# Patient Record
Sex: Female | Born: 1990 | Race: Black or African American | Hispanic: No | Marital: Single | Smoking: Former smoker
Health system: Southern US, Community
[De-identification: ages and names within clinical notes are randomized; demographics above are authoritative.]

## PROBLEM LIST (undated history)

## (undated) ENCOUNTER — Inpatient Hospital Stay (HOSPITAL_COMMUNITY): Payer: Self-pay

## (undated) DIAGNOSIS — Z8619 Personal history of other infectious and parasitic diseases: Secondary | ICD-10-CM

## (undated) DIAGNOSIS — Z87891 Personal history of nicotine dependence: Secondary | ICD-10-CM

## (undated) DIAGNOSIS — Z789 Other specified health status: Secondary | ICD-10-CM

## (undated) HISTORY — DX: Other specified health status: Z78.9

## (undated) HISTORY — PX: NO PAST SURGERIES: SHX2092

---

## 2011-09-24 ENCOUNTER — Encounter (HOSPITAL_COMMUNITY): Payer: Self-pay

## 2011-09-24 ENCOUNTER — Emergency Department (HOSPITAL_COMMUNITY)
Admission: EM | Admit: 2011-09-24 | Discharge: 2011-09-25 | Disposition: A | Discharge status: Home / Self Care | Payer: Self-pay | Attending: Emergency Medicine | Admitting: Emergency Medicine

## 2011-09-24 ENCOUNTER — Other Ambulatory Visit: Payer: Self-pay

## 2011-09-24 DIAGNOSIS — S01511A Laceration without foreign body of lip, initial encounter: Secondary | ICD-10-CM

## 2011-09-24 DIAGNOSIS — T40904A Poisoning by unspecified psychodysleptics [hallucinogens], undetermined, initial encounter: Secondary | ICD-10-CM | POA: Insufficient documentation

## 2011-09-24 DIAGNOSIS — R55 Syncope and collapse: Secondary | ICD-10-CM | POA: Insufficient documentation

## 2011-09-24 DIAGNOSIS — T40901A Poisoning by unspecified psychodysleptics [hallucinogens], accidental (unintentional), initial encounter: Secondary | ICD-10-CM | POA: Insufficient documentation

## 2011-09-24 DIAGNOSIS — S0083XA Contusion of other part of head, initial encounter: Secondary | ICD-10-CM

## 2011-09-24 DIAGNOSIS — Y92009 Unspecified place in unspecified non-institutional (private) residence as the place of occurrence of the external cause: Secondary | ICD-10-CM | POA: Insufficient documentation

## 2011-09-24 DIAGNOSIS — S0003XA Contusion of scalp, initial encounter: Secondary | ICD-10-CM | POA: Insufficient documentation

## 2011-09-24 DIAGNOSIS — W19XXXA Unspecified fall, initial encounter: Secondary | ICD-10-CM | POA: Insufficient documentation

## 2011-09-24 DIAGNOSIS — S01501A Unspecified open wound of lip, initial encounter: Secondary | ICD-10-CM | POA: Insufficient documentation

## 2011-09-24 NOTE — ED Notes (Signed)
Pt presents with no acute distress.  Pt admits to smoking pot just before passing out.  Pt has quarter size contusion to forehead- denies neck and back pain

## 2011-09-25 LAB — POCT I-STAT, CHEM 8
BUN: 14 mg/dL (ref 6–23)
Calcium, Ion: 1.25 mmol/L (ref 1.12–1.32)
Chloride: 103 mEq/L (ref 96–112)
HCT: 40 % (ref 36.0–46.0)
Sodium: 141 mEq/L (ref 135–145)
TCO2: 27 mmol/L (ref 0–100)

## 2011-09-25 LAB — URINALYSIS, ROUTINE W REFLEX MICROSCOPIC
Glucose, UA: NEGATIVE mg/dL
Hgb urine dipstick: NEGATIVE
Leukocytes, UA: NEGATIVE
Protein, ur: NEGATIVE mg/dL
Specific Gravity, Urine: 1.024 (ref 1.005–1.030)

## 2011-09-25 LAB — PREGNANCY, URINE: Preg Test, Ur: NEGATIVE

## 2011-09-25 NOTE — Discharge Instructions (Signed)
Your testing has been normal, I suspect this is related to being slightly dehydrated and standing up quickly. Please return to the emergency department for severe or worsening symptoms including worsening headache, nausea vomiting or change in vision. You should also return to the emergency department for palpitations, chest pain or recurrent fainting spells. Call your doctor in the morning to schedule a followup appointment for further testing.  RESOURCE GUIDE  Dental Problems  Patients with Medicaid: [facility] [phone] [address]                                           [phone] [address] Phone:  [phone]                                                  Phone:  [phone]  If unable to pay or uninsured, contact:  [facility] or [facility] to become qualified for the adult dental clinic.  Chronic Pain Problems Contact [facility]  [phone] Patients need to be referred by their primary care doctor.  Insufficient Money for Medicine Contact United Way:  call "211" or [facility] [phone].  No Primary Care Doctor Call [facility]  [phone] Other agencies that provide inexpensive medical care    [facility]  [phone]    [facility]  [phone]    [facility]  [phone]    [facility]  [phone]    [facility]  [phone]    [facility]  [phone]  Psychological Services [facility]  [phone] [facility] Services  [phone] [facility]   [phone] (emergency services [phone])  Substance Abuse Resources [facility]  [phone] [facility] [phone] [facility] [place] [phone] Floydene Flock [phone] Residential & Outpatient Substance Abuse Program  [phone]  Abuse/Neglect [facility] Child Abuse Hotline 970-631-5323 [facility] Child  Abuse Hotline (667) 352-6453 (After Hours)  Emergency Shelter [facility] Ministries [phone]  Maternity Homes [facility] [place] [facility] [place] [phone] [place] Alert Services [phone]  MRSA Hotline #:   281-506-0941    [facility] Resources  [facility] [place]     United [employer]                          [facility] Dept. [address] [place]                       [address]      [address]  Patrecia Pace  [facility] Phone:  [phone]                                   Phone:  [phone]                 Phone:  [place] Phone:  [place] [phone] (After Hours)
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
## 2014-01-13 ENCOUNTER — Emergency Department (INDEPENDENT_AMBULATORY_CARE_PROVIDER_SITE_OTHER): Payer: Self-pay

## 2014-01-13 ENCOUNTER — Encounter (HOSPITAL_COMMUNITY): Payer: Self-pay | Admitting: Emergency Medicine

## 2014-01-13 ENCOUNTER — Emergency Department (INDEPENDENT_AMBULATORY_CARE_PROVIDER_SITE_OTHER)
Admission: EM | Admit: 2014-01-13 | Discharge: 2014-01-13 | Disposition: A | Discharge status: Home / Self Care | Payer: Self-pay | Source: Home / Self Care

## 2014-01-13 DIAGNOSIS — W108XXA Fall (on) (from) other stairs and steps, initial encounter: Secondary | ICD-10-CM

## 2014-01-13 DIAGNOSIS — S82899A Other fracture of unspecified lower leg, initial encounter for closed fracture: Secondary | ICD-10-CM

## 2014-01-13 DIAGNOSIS — T148XXA Other injury of unspecified body region, initial encounter: Secondary | ICD-10-CM

## 2014-01-13 DIAGNOSIS — S82892A Other fracture of left lower leg, initial encounter for closed fracture: Secondary | ICD-10-CM

## 2014-01-13 DIAGNOSIS — IMO0002 Reserved for concepts with insufficient information to code with codable children: Secondary | ICD-10-CM

## 2014-01-13 NOTE — ED Notes (Signed)
Applied telfa and bacitracin to left elbow and wrapped w/coban
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
## 2014-01-13 NOTE — Discharge Instructions (Signed)
You have a possible small ankle break This will take a total of 6 weeks to heal.  Please keep the boot on for at least 4 weeks and take your foot our 2 times daily for range of motion exercises If your pain persists at 4 weeks then keep the boot on ffor a total of 6 weeks. IIf you are not better then come back  Please keep the scratch on your left elbow clean.

## 2014-01-16 NOTE — ED Provider Notes (Signed)
Medical screening examination/treatment/procedure(s) were performed by resident physician or non-physician practitioner and as supervising physician I was immediately available for consultation/collaboration.   Barkley BrunsKINDL,JAMES DOUGLAS MD.   Linna HoffJames D Kindl, MD 01/16/14 1006

## 2014-06-23 DIAGNOSIS — Z8619 Personal history of other infectious and parasitic diseases: Secondary | ICD-10-CM

## 2014-06-23 HISTORY — DX: Personal history of other infectious and parasitic diseases: Z86.19

## 2014-07-02 ENCOUNTER — Ambulatory Visit (INDEPENDENT_AMBULATORY_CARE_PROVIDER_SITE_OTHER): Payer: Self-pay | Admitting: Family

## 2014-07-02 ENCOUNTER — Encounter: Payer: Self-pay | Admitting: Family

## 2014-07-02 VITALS — BP 130/76 | HR 96 | Ht 67.0 in | Wt 159.6 lb

## 2014-07-02 DIAGNOSIS — O0930 Supervision of pregnancy with insufficient antenatal care, unspecified trimester: Secondary | ICD-10-CM | POA: Insufficient documentation

## 2014-07-02 DIAGNOSIS — O0933 Supervision of pregnancy with insufficient antenatal care, third trimester: Secondary | ICD-10-CM

## 2014-07-02 DIAGNOSIS — Z23 Encounter for immunization: Secondary | ICD-10-CM

## 2014-07-02 DIAGNOSIS — O0932 Supervision of pregnancy with insufficient antenatal care, second trimester: Secondary | ICD-10-CM

## 2014-07-02 LAB — POCT URINALYSIS DIP (DEVICE)
BILIRUBIN URINE: NEGATIVE
GLUCOSE, UA: NEGATIVE mg/dL
HGB URINE DIPSTICK: NEGATIVE
Ketones, ur: NEGATIVE mg/dL
NITRITE: POSITIVE — AB
PH: 7.5 (ref 5.0–8.0)
Protein, ur: NEGATIVE mg/dL
Specific Gravity, Urine: 1.02 (ref 1.005–1.030)
UROBILINOGEN UA: 0.2 mg/dL (ref 0.0–1.0)

## 2014-07-02 NOTE — Progress Notes (Signed)
   Subjective:    Sara Singleton Singleton is a G1P0 5527w3d being seen today for her first obstetrical visit.  She is currently incarcerated and is accompanied by the female Public relations account executivecorrectional officer.Her obstetrical history is uncomplicated. Patient does intend to breast feed. Pregnancy history fully reviewed.  Patient reports no bleeding, no contractions, no cramping and no leaking.  Filed Vitals:   07/02/14 1005 07/02/14 1006  BP: 130/76   Pulse: 96   Height:  5\' 7"  (1.702 m)  Weight: 159 lb 9.6 oz (72.394 kg)     HISTORY: OB History  Gravida Para Term Preterm AB SAB TAB Ectopic Multiple Living  1             # Outcome Date GA Lbr Len/2nd Weight Sex Delivery Anes PTL Lv  1 Current              Past Medical History  Diagnosis Date  . Medical history non-contributory    Past Surgical History  Procedure Laterality Date  . No past surgeries     Family History  Problem Relation Age of Onset  . Hypertension Mother   . Hypertension Father      Exam    Uterus:  Fundal Height: 24 cm  Pelvic Exam:    Perineum: No Hemorrhoids, Normal Perineum   Vulva: normal   Vagina:  normal mucosa, normal discharge, no palpable nodules   pH: n/a   Cervix: no cervical motion tenderness, no lesions and small amount of bleeding noted following Pap   Adnexa: normal adnexa and no mass, fullness, tenderness   Bony Pelvis: average  System: Breast:  normal appearance, no masses or tenderness   Skin: normal coloration and turgor, no rashes, several tatoos    Neurologic: oriented, normal, normal mood, oriented   Extremities: normal strength, tone, and muscle mass, no deformities, ROM of all joints is normal   HEENT PERRLA, sclera clear, anicteric, neck supple with midline trachea and trachea midline   Mouth/Teeth mucous membranes moist, pharynx normal without lesions   Neck supple and no masses   Cardiovascular: regular rate and rhythm, no murmurs or gallops   Respiratory:  appears well, vitals normal, no  respiratory distress, acyanotic, normal RR, ear and throat exam is normal, neck free of mass or lymphadenopathy, chest clear, no wheezing, crepitations, rhonchi, normal symmetric air entry   Abdomen: gravid, non-tender with positive bowel sounds   Urinary: bladder not palpable      Assessment:    Pregnancy: G1P0 Patient Active Problem List   Diagnosis Date Noted  . Late prenatal care 07/02/2014        Plan:    Initial labs drawn. Prenatal vitamins. Problem list reviewed and updated. Genetic Screening discussed : too late for genetic testing  Ultrasound discussed; fetal survey: ordered, date give to correctional officer  Follow up in 4 weeks, date given to correctional officer 50% of 40 min visit spent on counseling and coordination of care.     Sara Singleton SHWON 07/02/2014 11:07 AM

## 2014-07-02 NOTE — Patient Instructions (Signed)
Second Trimester of Pregnancy The second trimester is from week 13 through week 28, months 4 through 6. The second trimester is often a time when you feel your best. Your body has also adjusted to being pregnant, and you begin to feel better physically. Usually, morning sickness has lessened or quit completely, you may have more energy, and you may have an increase in appetite. The second trimester is also a time when the fetus is growing rapidly. At the end of the sixth month, the fetus is about 9 inches long and weighs about 1 pounds. You will likely begin to feel the baby move (quickening) between 18 and 20 weeks of the pregnancy. BODY CHANGES Your body goes through many changes during pregnancy. The changes vary from woman to woman.   Your weight will continue to increase. You will notice your lower abdomen bulging out.  You may begin to get stretch marks on your hips, abdomen, and breasts.  You may develop headaches that can be relieved by medicines approved by your health care provider.  You may urinate more often because the fetus is pressing on your bladder.  You may develop or continue to have heartburn as a result of your pregnancy.  You may develop constipation because certain hormones are causing the muscles that push waste through your intestines to slow down.  You may develop hemorrhoids or swollen, bulging veins (varicose veins).  You may have back pain because of the weight gain and pregnancy hormones relaxing your joints between the bones in your pelvis and as a result of a shift in weight and the muscles that support your balance.  Your breasts will continue to grow and be tender.  Your gums may bleed and may be sensitive to brushing and flossing.  Dark spots or blotches (chloasma, mask of pregnancy) may develop on your face. This will likely fade after the baby is born.  A dark line from your belly button to the pubic area (linea nigra) may appear. This will likely fade  after the baby is born.  You may have changes in your hair. These can include thickening of your hair, rapid growth, and changes in texture. Some women also have hair loss during or after pregnancy, or hair that feels dry or thin. Your hair will most likely return to normal after your baby is born. WHAT TO EXPECT AT YOUR PRENATAL VISITS During a routine prenatal visit:  You will be weighed to make sure you and the fetus are growing normally.  Your blood pressure will be taken.  Your abdomen will be measured to track your baby's growth.  The fetal heartbeat will be listened to.  Any test results from the previous visit will be discussed. Your health care provider may ask you:  How you are feeling.  If you are feeling the baby move.  If you have had any abnormal symptoms, such as leaking fluid, bleeding, severe headaches, or abdominal cramping.  If you have any questions. Other tests that may be performed during your second trimester include:  Blood tests that check for:  Low iron levels (anemia).  Gestational diabetes (between 24 and 28 weeks).  Rh antibodies.  Urine tests to check for infections, diabetes, or protein in the urine.  An ultrasound to confirm the proper growth and development of the baby.  An amniocentesis to check for possible genetic problems.  Fetal screens for spina bifida and Down syndrome. HOME CARE INSTRUCTIONS   Avoid all smoking, herbs, alcohol, and unprescribed   drugs. These chemicals affect the formation and growth of the baby.  Follow your health care provider's instructions regarding medicine use. There are medicines that are either safe or unsafe to take during pregnancy.  Exercise only as directed by your health care provider. Experiencing uterine cramps is a good sign to stop exercising.  Continue to eat regular, healthy meals.  Wear a good support bra for breast tenderness.  Do not use hot tubs, steam rooms, or saunas.  Wear your  seat belt at all times when driving.  Avoid raw meat, uncooked cheese, cat litter boxes, and soil used by cats. These carry germs that can cause birth defects in the baby.  Take your prenatal vitamins.  Try taking a stool softener (if your health care provider approves) if you develop constipation. Eat more high-fiber foods, such as fresh vegetables or fruit and whole grains. Drink plenty of fluids to keep your urine clear or pale yellow.  Take warm sitz baths to soothe any pain or discomfort caused by hemorrhoids. Use hemorrhoid cream if your health care provider approves.  If you develop varicose veins, wear support hose. Elevate your feet for 15 minutes, 3-4 times a day. Limit salt in your diet.  Avoid heavy lifting, wear low heel shoes, and practice good posture.  Rest with your legs elevated if you have leg cramps or low back pain.  Visit your dentist if you have not gone yet during your pregnancy. Use a soft toothbrush to brush your teeth and be gentle when you floss.  A sexual relationship may be continued unless your health care provider directs you otherwise.  Continue to go to all your prenatal visits as directed by your health care provider. SEEK MEDICAL CARE IF:   You have dizziness.  You have mild pelvic cramps, pelvic pressure, or nagging pain in the abdominal area.  You have persistent nausea, vomiting, or diarrhea.  You have a bad smelling vaginal discharge.  You have pain with urination. SEEK IMMEDIATE MEDICAL CARE IF:   You have a fever.  You are leaking fluid from your vagina.  You have spotting or bleeding from your vagina.  You have severe abdominal cramping or pain.  You have rapid weight gain or loss.  You have shortness of breath with chest pain.  You notice sudden or extreme swelling of your face, hands, ankles, feet, or legs.  You have not felt your baby move in over an hour.  You have severe headaches that do not go away with  medicine.  You have vision changes. Document Released: 07/04/2001 Document Revised: 07/15/2013 Document Reviewed: 09/10/2012 ExitCare Patient Information 2015 [employer]. This information is not intended to replace advice given to you by your health care provider. Make sure you discuss any questions you have with your health care provider.  

## 2014-07-02 NOTE — Progress Notes (Signed)
I examined pt and agree with documentation above and nurse midwife student plan of care. Teague Goynes N Karim, CNM  

## 2014-07-02 NOTE — Progress Notes (Signed)
Anatomy U/S with Radiology 07/08/14 @ 930a.

## 2014-07-03 LAB — PRENATAL PROFILE (SOLSTAS)
ANTIBODY SCREEN: NEGATIVE
Basophils Absolute: 0.1 10*3/uL (ref 0.0–0.1)
Basophils Relative: 1 % (ref 0–1)
EOS ABS: 0.1 10*3/uL (ref 0.0–0.7)
EOS PCT: 1 % (ref 0–5)
HCT: 32.1 % — ABNORMAL LOW (ref 36.0–46.0)
HEMOGLOBIN: 11 g/dL — AB (ref 12.0–15.0)
HIV 1&2 Ab, 4th Generation: NONREACTIVE
Hepatitis B Surface Ag: NEGATIVE
LYMPHS ABS: 1.7 10*3/uL (ref 0.7–4.0)
Lymphocytes Relative: 21 % (ref 12–46)
MCH: 28.1 pg (ref 26.0–34.0)
MCHC: 34.3 g/dL (ref 30.0–36.0)
MCV: 81.9 fL (ref 78.0–100.0)
MONO ABS: 0.5 10*3/uL (ref 0.1–1.0)
MONOS PCT: 6 % (ref 3–12)
MPV: 10.7 fL (ref 9.4–12.4)
Neutro Abs: 5.9 10*3/uL (ref 1.7–7.7)
Neutrophils Relative %: 71 % (ref 43–77)
Platelets: 237 10*3/uL (ref 150–400)
RBC: 3.92 MIL/uL (ref 3.87–5.11)
RDW: 12.4 % (ref 11.5–15.5)
RH TYPE: POSITIVE
RUBELLA: 1.19 {index} — AB (ref <0.90)
WBC: 8.3 10*3/uL (ref 4.0–10.5)

## 2014-07-04 LAB — PRESCRIPTION MONITORING PROFILE (19 PANEL)
AMPHETAMINE/METH: NEGATIVE ng/mL
BARBITURATE SCREEN, URINE: NEGATIVE ng/mL
BENZODIAZEPINE SCREEN, URINE: NEGATIVE ng/mL
BUPRENORPHINE, URINE: NEGATIVE ng/mL
CANNABINOID SCRN UR: NEGATIVE ng/mL
COCAINE METABOLITES: NEGATIVE ng/mL
Carisoprodol, Urine: NEGATIVE ng/mL
Creatinine, Urine: 123.93 mg/dL (ref >20.0)
FENTANYL URINE: NEGATIVE ng/mL
MDMA URINE: NEGATIVE ng/mL
Meperidine, Ur: NEGATIVE ng/mL
Methadone Screen, Urine: NEGATIVE ng/mL
Methaqualone: NEGATIVE ng/mL
Nitrites, Initial: NEGATIVE ug/mL
OPIATE SCREEN, URINE: NEGATIVE ng/mL
Oxycodone Screen, Ur: NEGATIVE ng/mL
PHENCYCLIDINE, UR: NEGATIVE ng/mL
Propoxyphene: NEGATIVE ng/mL
TAPENTADOLUR: NEGATIVE ng/mL
Tramadol Scrn, Ur: NEGATIVE ng/mL
ZOLPIDEM, URINE: NEGATIVE ng/mL
pH, Initial: 7.9 pH (ref 4.5–8.9)

## 2014-07-05 LAB — CULTURE, OB URINE: Colony Count: >=100000

## 2014-07-06 LAB — HEMOGLOBINOPATHY EVALUATION
HEMOGLOBIN OTHER: 0 %
HGB A: 97.1 % (ref 96.8–97.8)
HGB F QUANT: 0 % (ref 0.0–2.0)
HGB S QUANTITAION: 0 %
Hgb A2 Quant: 2.9 % (ref 2.2–3.2)

## 2014-07-06 LAB — CYTOLOGY - PAP

## 2014-07-08 ENCOUNTER — Ambulatory Visit (HOSPITAL_COMMUNITY): Payer: Self-pay

## 2014-07-09 ENCOUNTER — Other Ambulatory Visit: Payer: Self-pay | Admitting: Family

## 2014-07-09 ENCOUNTER — Encounter: Payer: Self-pay | Admitting: Family

## 2014-07-09 ENCOUNTER — Encounter: Payer: Self-pay | Admitting: *Deleted

## 2014-07-09 ENCOUNTER — Telehealth: Payer: Self-pay | Admitting: *Deleted

## 2014-07-09 DIAGNOSIS — O98212 Gonorrhea complicating pregnancy, second trimester: Secondary | ICD-10-CM | POA: Insufficient documentation

## 2014-07-09 DIAGNOSIS — A749 Chlamydial infection, unspecified: Secondary | ICD-10-CM | POA: Insufficient documentation

## 2014-07-09 DIAGNOSIS — O98812 Other maternal infectious and parasitic diseases complicating pregnancy, second trimester: Secondary | ICD-10-CM

## 2014-07-09 MED ORDER — CEPHALEXIN 500 MG PO CAPS: 500.0000 mg | ORAL_CAPSULE | Freq: Three times a day (TID) | ORAL | Status: DC

## 2014-07-09 NOTE — Progress Notes (Unsigned)
Pt called and left message to call clinic.  When patient calls please inform of positive chlamydia and gonorrhea on pap smear results.  Need to come to clinic for treatment.  [facility] [address].

## 2014-07-09 NOTE — Telephone Encounter (Signed)
-----   Message from Marlis EdelsonWalidah N Karim, CNM sent at 07/09/2014  1:19 PM EST ----- Regarding: Pt need treatment for Gonorrhea and chlamydia.   Pt called and left message to call clinic.  When patient calls please inform of positive chlamydia and gonorrhea on pap smear results.  Need to come to clinic for treatment.  Also [facility] to [facility] [address].     ----- Message -----    From: Lab in [facility] Interface    Sent: 07/02/2014   3:52 PM      To: Marlis EdelsonWalidah N Karim, CNM

## 2014-07-09 NOTE — Telephone Encounter (Signed)
[facility] Westly PamLacy,  Results given, jail states that they will treat patient, protocol read over the phone.    STD card filled out and faxed.

## 2014-07-10 ENCOUNTER — Ambulatory Visit (HOSPITAL_COMMUNITY)
Admission: RE | Admit: 2014-07-10 | Discharge: 2014-07-10 | Disposition: A | Discharge status: Home / Self Care | Payer: Self-pay | Source: Ambulatory Visit | Attending: Family | Admitting: Family

## 2014-07-10 DIAGNOSIS — O0933 Supervision of pregnancy with insufficient antenatal care, third trimester: Secondary | ICD-10-CM | POA: Insufficient documentation

## 2014-07-10 DIAGNOSIS — Z3689 Encounter for other specified antenatal screening: Secondary | ICD-10-CM | POA: Insufficient documentation

## 2014-07-10 DIAGNOSIS — Z36 Encounter for antenatal screening of mother: Secondary | ICD-10-CM | POA: Insufficient documentation

## 2014-07-10 DIAGNOSIS — Z3A28 28 weeks gestation of pregnancy: Secondary | ICD-10-CM | POA: Insufficient documentation

## 2014-07-11 ENCOUNTER — Encounter: Payer: Self-pay | Admitting: Family

## 2014-07-24 NOTE — L&D Delivery Note (Signed)
Delivery Note At 2:18 AM a viable and healthy female was delivered via Vaginal, Spontaneous Delivery (Presentation: ; Occiput Anterior).  APGAR: 8, 9; weight pending  .   Placenta status: Intact, Spontaneous.  Cord: 3 vessels with the following complications: None.    Anesthesia: Epidural  Episiotomy: None Lacerations: 2nd degree;Perineal Suture Repair: 3.0 monocryl Est. Blood Loss (mL): 300  Mom to postpartum.  Baby to Couplet care / Skin to Skin.  Donald ProseBrenda Salome is a 24 y.o. female G1P0 with IUP at 7357w4d admitted for active labor.  She progressed without augmentation to complete and pushed less than 30 minutes to deliver.  Cord clamping delayed by several minutes then clamped by CNM and cut by family member.  Placenta intact and spontaneous, bleeding minimal.  2nd degree perineal laceration repaired without difficulty.  Mom and baby stable prior to transfer to postpartum. She plans on breastfeeding. She requests Nexplanon for birth control.   LEFTWICH-KIRBY, Shaquetta Arcos 09/29/2014, 2:43 AM

## 2014-07-30 ENCOUNTER — Ambulatory Visit (INDEPENDENT_AMBULATORY_CARE_PROVIDER_SITE_OTHER): Payer: Self-pay | Admitting: Obstetrics & Gynecology

## 2014-07-30 VITALS — BP 102/54 | HR 87 | Temp 98.1°F | Wt 160.0 lb

## 2014-07-30 DIAGNOSIS — Z23 Encounter for immunization: Secondary | ICD-10-CM

## 2014-07-30 DIAGNOSIS — O98312 Other infections with a predominantly sexual mode of transmission complicating pregnancy, second trimester: Secondary | ICD-10-CM

## 2014-07-30 DIAGNOSIS — A749 Chlamydial infection, unspecified: Secondary | ICD-10-CM

## 2014-07-30 DIAGNOSIS — Z3482 Encounter for supervision of other normal pregnancy, second trimester: Secondary | ICD-10-CM

## 2014-07-30 DIAGNOSIS — Z3492 Encounter for supervision of normal pregnancy, unspecified, second trimester: Secondary | ICD-10-CM

## 2014-07-30 DIAGNOSIS — O98212 Gonorrhea complicating pregnancy, second trimester: Secondary | ICD-10-CM

## 2014-07-30 DIAGNOSIS — O98812 Other maternal infectious and parasitic diseases complicating pregnancy, second trimester: Principal | ICD-10-CM

## 2014-07-30 LAB — POCT URINALYSIS DIP (DEVICE)
Bilirubin Urine: NEGATIVE
GLUCOSE, UA: NEGATIVE mg/dL
HGB URINE DIPSTICK: NEGATIVE
Ketones, ur: NEGATIVE mg/dL
Nitrite: NEGATIVE
PH: 7 (ref 5.0–8.0)
PROTEIN: NEGATIVE mg/dL
SPECIFIC GRAVITY, URINE: 1.015 (ref 1.005–1.030)
UROBILINOGEN UA: 0.2 mg/dL (ref 0.0–1.0)

## 2014-07-30 MED ORDER — TETANUS-DIPHTH-ACELL PERTUSSIS 5-2.5-18.5 LF-MCG/0.5 IM SUSP
0.5000 mL | Freq: Once | INTRAMUSCULAR | Status: AC
  Administered 2014-07-30: 0.5 mL via INTRAMUSCULAR

## 2014-07-30 NOTE — Progress Notes (Signed)
TOC CT and GC done. US 07/10/14 reviewed. C/W possible yeast, no DC noted, sureswab ordered

## 2014-07-31 LAB — CBC
HCT: 33 % — ABNORMAL LOW (ref 36.0–46.0)
Hemoglobin: 10.7 g/dL — ABNORMAL LOW (ref 12.0–15.0)
MCH: 27.8 pg (ref 26.0–34.0)
MCHC: 32.4 g/dL (ref 30.0–36.0)
MCV: 85.7 fL (ref 78.0–100.0)
MPV: 11.4 fL (ref 8.6–12.4)
PLATELETS: 211 10*3/uL (ref 150–400)
RBC: 3.85 MIL/uL — AB (ref 3.87–5.11)
RDW: 12.9 % (ref 11.5–15.5)
WBC: 7.8 10*3/uL (ref 4.0–10.5)

## 2014-07-31 LAB — RPR

## 2014-07-31 LAB — GLUCOSE TOLERANCE, 1 HOUR (50G) W/O FASTING: GLUCOSE 1 HOUR GTT: 129 mg/dL (ref 70–140)

## 2014-08-01 LAB — GC/CHLAMYDIA PROBE AMP
CT Probe RNA: NEGATIVE
GC Probe RNA: NEGATIVE

## 2014-08-05 LAB — SURESWAB, VAGINOSIS/VAGINITIS PLUS
Atopobium vaginae: DETECTED Log (cells/mL)
C. ALBICANS, DNA: DETECTED — AB
C. TRACHOMATIS RNA, TMA: NOT DETECTED
C. glabrata, DNA: NOT DETECTED
C. parapsilosis, DNA: NOT DETECTED
C. tropicalis, DNA: NOT DETECTED
LACTOBACILLUS SPECIES: NOT DETECTED Log (cells/mL)
MEGASPHAERA SPECIES: 7.5 Log (cells/mL)
N. gonorrhoeae RNA, TMA: NOT DETECTED
T. vaginalis RNA, QL TMA: DETECTED — AB

## 2014-08-06 ENCOUNTER — Telehealth: Payer: Self-pay

## 2014-08-06 DIAGNOSIS — B9689 Other specified bacterial agents as the cause of diseases classified elsewhere: Secondary | ICD-10-CM

## 2014-08-06 DIAGNOSIS — N760 Acute vaginitis: Principal | ICD-10-CM

## 2014-08-06 MED ORDER — METRONIDAZOLE 500 MG PO TABS: 500.0000 mg | ORAL_TABLET | Freq: Two times a day (BID) | ORAL | Status: DC

## 2014-08-06 NOTE — Telephone Encounter (Signed)
-----   Message from Adam PhenixJames G Arnold, MD sent at 08/06/2014  9:01 AM EST ----- rx flagyl 500 mg BID 7 days BV

## 2014-08-06 NOTE — Telephone Encounter (Signed)
[phone][facility] and spoke to nurse Selena BattenKim. Informed her of RX. She requested RX be faxed to [phone] so that it can be filed in patient's chart. RX faxed per request.

## 2014-08-13 ENCOUNTER — Ambulatory Visit (INDEPENDENT_AMBULATORY_CARE_PROVIDER_SITE_OTHER): Payer: Self-pay | Admitting: Obstetrics and Gynecology

## 2014-08-13 ENCOUNTER — Encounter: Payer: Self-pay | Admitting: Obstetrics and Gynecology

## 2014-08-13 VITALS — BP 118/62 | HR 73 | Temp 97.5°F | Wt 161.2 lb

## 2014-08-13 DIAGNOSIS — O98212 Gonorrhea complicating pregnancy, second trimester: Secondary | ICD-10-CM

## 2014-08-13 DIAGNOSIS — O0933 Supervision of pregnancy with insufficient antenatal care, third trimester: Secondary | ICD-10-CM

## 2014-08-13 LAB — POCT URINALYSIS DIP (DEVICE)
BILIRUBIN URINE: NEGATIVE
Glucose, UA: NEGATIVE mg/dL
Hgb urine dipstick: NEGATIVE
KETONES UR: NEGATIVE mg/dL
NITRITE: NEGATIVE
PH: 7 (ref 5.0–8.0)
Protein, ur: NEGATIVE mg/dL
SPECIFIC GRAVITY, URINE: 1.015 (ref 1.005–1.030)
UROBILINOGEN UA: 0.2 mg/dL (ref 0.0–1.0)

## 2014-08-13 NOTE — Progress Notes (Signed)
Patient is doing well without complaints. FM/PTL precautions reviewed.  

## 2014-08-27 ENCOUNTER — Ambulatory Visit (INDEPENDENT_AMBULATORY_CARE_PROVIDER_SITE_OTHER): Payer: Self-pay | Admitting: Family Medicine

## 2014-08-27 VITALS — BP 115/72 | HR 86 | Temp 98.0°F | Wt 162.0 lb

## 2014-08-27 DIAGNOSIS — O0933 Supervision of pregnancy with insufficient antenatal care, third trimester: Secondary | ICD-10-CM

## 2014-08-27 LAB — POCT URINALYSIS DIP (DEVICE)
Bilirubin Urine: NEGATIVE
GLUCOSE, UA: NEGATIVE mg/dL
HGB URINE DIPSTICK: NEGATIVE
KETONES UR: NEGATIVE mg/dL
NITRITE: NEGATIVE
PH: 7 (ref 5.0–8.0)
Protein, ur: NEGATIVE mg/dL
Specific Gravity, Urine: 1.01 (ref 1.005–1.030)
UROBILINOGEN UA: 0.2 mg/dL (ref 0.0–1.0)

## 2014-08-27 NOTE — Progress Notes (Signed)
Patient is 24 y.o. G1P0 5790w6d.  +FM, denies LOF, VB, contractions, vaginal discharge.  Overall feeling well.

## 2014-09-10 ENCOUNTER — Ambulatory Visit (INDEPENDENT_AMBULATORY_CARE_PROVIDER_SITE_OTHER): Payer: Self-pay | Admitting: Family Medicine

## 2014-09-10 VITALS — BP 115/76 | HR 101 | Temp 98.2°F | Wt 163.0 lb

## 2014-09-10 DIAGNOSIS — O0933 Supervision of pregnancy with insufficient antenatal care, third trimester: Secondary | ICD-10-CM

## 2014-09-10 LAB — POCT URINALYSIS DIP (DEVICE)
Bilirubin Urine: NEGATIVE
GLUCOSE, UA: NEGATIVE mg/dL
Hgb urine dipstick: NEGATIVE
Ketones, ur: NEGATIVE mg/dL
NITRITE: NEGATIVE
Protein, ur: NEGATIVE mg/dL
SPECIFIC GRAVITY, URINE: 1.015 (ref 1.005–1.030)
Urobilinogen, UA: 1 mg/dL (ref 0.0–1.0)
pH: 7.5 (ref 5.0–8.0)

## 2014-09-10 NOTE — Patient Instructions (Signed)
Third Trimester of Pregnancy The third trimester is from week 29 through week 42, months 7 through 9. The third trimester is a time when the fetus is growing rapidly. At the end of the ninth month, the fetus is about 20 inches in length and weighs 6-10 pounds.  BODY CHANGES Your body goes through many changes during pregnancy. The changes vary from woman to woman.   Your weight will continue to increase. You can expect to gain 25-35 pounds (11-16 kg) by the end of the pregnancy.  You may begin to get stretch marks on your hips, abdomen, and breasts.  You may urinate more often because the fetus is moving lower into your pelvis and pressing on your bladder.  You may develop or continue to have heartburn as a result of your pregnancy.  You may develop constipation because certain hormones are causing the muscles that push waste through your intestines to slow down.  You may develop hemorrhoids or swollen, bulging veins (varicose veins).  You may have pelvic pain because of the weight gain and pregnancy hormones relaxing your joints between the bones in your pelvis. Backaches may result from overexertion of the muscles supporting your posture.  You may have changes in your hair. These can include thickening of your hair, rapid growth, and changes in texture. Some women also have hair loss during or after pregnancy, or hair that feels dry or thin. Your hair will most likely return to normal after your baby is born.  Your breasts will continue to grow and be tender. A yellow discharge may leak from your breasts called colostrum.  Your belly button may stick out.  You may feel short of breath because of your expanding uterus.  You may notice the fetus "dropping," or moving lower in your abdomen.  You may have a bloody mucus discharge. This usually occurs a few days to a week before labor begins.  Your cervix becomes thin and soft (effaced) near your due date. WHAT TO EXPECT AT YOUR PRENATAL  EXAMS  You will have prenatal exams every 2 weeks until week 36. Then, you will have weekly prenatal exams. During a routine prenatal visit:  You will be weighed to make sure you and the fetus are growing normally.  Your blood pressure is taken.  Your abdomen will be measured to track your baby's growth.  The fetal heartbeat will be listened to.  Any test results from the previous visit will be discussed.  You may have a cervical check near your due date to see if you have effaced. At around 36 weeks, your caregiver will check your cervix. At the same time, your caregiver will also perform a test on the secretions of the vaginal tissue. This test is to determine if a type of bacteria, Group B streptococcus, is present. Your caregiver will explain this further. Your caregiver may ask you:  What your birth plan is.  How you are feeling.  If you are feeling the baby move.  If you have had any abnormal symptoms, such as leaking fluid, bleeding, severe headaches, or abdominal cramping.  If you have any questions. Other tests or screenings that may be performed during your third trimester include:  Blood tests that check for low iron levels (anemia).  Fetal testing to check the health, activity level, and growth of the fetus. Testing is done if you have certain medical conditions or if there are problems during the pregnancy. FALSE LABOR You may feel small, irregular contractions that   eventually go away. These are called Braxton Hicks contractions, or false labor. Contractions may last for hours, days, or even weeks before true labor sets in. If contractions come at regular intervals, intensify, or become painful, it is best to be seen by your caregiver.  SIGNS OF LABOR   Menstrual-like cramps.  Contractions that are 5 minutes apart or less.  Contractions that start on the top of the uterus and spread down to the lower abdomen and back.  A sense of increased pelvic pressure or back  pain.  A watery or bloody mucus discharge that comes from the vagina. If you have any of these signs before the 37th week of pregnancy, call your caregiver right away. You need to go to the hospital to get checked immediately. HOME CARE INSTRUCTIONS   Avoid all smoking, herbs, alcohol, and unprescribed drugs. These chemicals affect the formation and growth of the baby.  Follow your caregiver's instructions regarding medicine use. There are medicines that are either safe or unsafe to take during pregnancy.  Exercise only as directed by your caregiver. Experiencing uterine cramps is a good sign to stop exercising.  Continue to eat regular, healthy meals.  Wear a good support bra for breast tenderness.  Do not use hot tubs, steam rooms, or saunas.  Wear your seat belt at all times when driving.  Avoid raw meat, uncooked cheese, cat litter boxes, and soil used by cats. These carry germs that can cause birth defects in the baby.  Take your prenatal vitamins.  Try taking a stool softener (if your caregiver approves) if you develop constipation. Eat more high-fiber foods, such as fresh vegetables or fruit and whole grains. Drink plenty of fluids to keep your urine clear or pale yellow.  Take warm sitz baths to soothe any pain or discomfort caused by hemorrhoids. Use hemorrhoid cream if your caregiver approves.  If you develop varicose veins, wear support hose. Elevate your feet for 15 minutes, 3-4 times a day. Limit salt in your diet.  Avoid heavy lifting, wear low heal shoes, and practice good posture.  Rest a lot with your legs elevated if you have leg cramps or low back pain.  Visit your dentist if you have not gone during your pregnancy. Use a soft toothbrush to brush your teeth and be gentle when you floss.  A sexual relationship may be continued unless your caregiver directs you otherwise.  Do not travel far distances unless it is absolutely necessary and only with the approval  of your caregiver.  Take prenatal classes to understand, practice, and ask questions about the labor and delivery.  Make a trial run to the hospital.  Pack your hospital bag.  Prepare the baby's nursery.  Continue to go to all your prenatal visits as directed by your caregiver. SEEK MEDICAL CARE IF:  You are unsure if you are in labor or if your water has broken.  You have dizziness.  You have mild pelvic cramps, pelvic pressure, or nagging pain in your abdominal area.  You have persistent nausea, vomiting, or diarrhea.  You have a bad smelling vaginal discharge.  You have pain with urination. SEEK IMMEDIATE MEDICAL CARE IF:   You have a fever.  You are leaking fluid from your vagina.  You have spotting or bleeding from your vagina.  You have severe abdominal cramping or pain.  You have rapid weight loss or gain.  You have shortness of breath with chest pain.  You notice sudden or extreme swelling   of your face, hands, ankles, feet, or legs.  You have not felt your baby move in over an hour.  You have severe headaches that do not go away with medicine.  You have vision changes. Document Released: 07/04/2001 Document Revised: 07/15/2013 Document Reviewed: 09/10/2012 ExitCare Patient Information 2015 [employer]. This information is not intended to replace advice given to you by your health care provider. Make sure you discuss any questions you have with your health care provider.  

## 2014-09-10 NOTE — Progress Notes (Signed)
GC/CT, GBS done today.  Labor and FM precautions given.

## 2014-09-10 NOTE — Addendum Note (Signed)
Addended by: Sherre LainASH, AMANDA A on: 09/10/2014 10:37 AM   Modules accepted: Orders

## 2014-09-11 LAB — OB RESULTS CONSOLE GC/CHLAMYDIA
CHLAMYDIA, DNA PROBE: NEGATIVE
Gonorrhea: NEGATIVE

## 2014-09-11 LAB — GC/CHLAMYDIA PROBE AMP
CT PROBE, AMP APTIMA: NEGATIVE
GC PROBE AMP APTIMA: NEGATIVE

## 2014-09-12 LAB — CULTURE, BETA STREP (GROUP B ONLY)

## 2014-09-17 ENCOUNTER — Encounter: Payer: Self-pay | Admitting: Obstetrics and Gynecology

## 2014-09-17 ENCOUNTER — Ambulatory Visit (INDEPENDENT_AMBULATORY_CARE_PROVIDER_SITE_OTHER): Payer: Self-pay | Admitting: Obstetrics and Gynecology

## 2014-09-17 VITALS — BP 121/58 | HR 89 | Temp 97.2°F | Wt 165.0 lb

## 2014-09-17 DIAGNOSIS — O98812 Other maternal infectious and parasitic diseases complicating pregnancy, second trimester: Secondary | ICD-10-CM

## 2014-09-17 DIAGNOSIS — O98312 Other infections with a predominantly sexual mode of transmission complicating pregnancy, second trimester: Secondary | ICD-10-CM

## 2014-09-17 DIAGNOSIS — O0933 Supervision of pregnancy with insufficient antenatal care, third trimester: Secondary | ICD-10-CM

## 2014-09-17 DIAGNOSIS — O98313 Other infections with a predominantly sexual mode of transmission complicating pregnancy, third trimester: Secondary | ICD-10-CM

## 2014-09-17 DIAGNOSIS — O98213 Gonorrhea complicating pregnancy, third trimester: Secondary | ICD-10-CM

## 2014-09-17 DIAGNOSIS — O98212 Gonorrhea complicating pregnancy, second trimester: Secondary | ICD-10-CM

## 2014-09-17 DIAGNOSIS — A749 Chlamydial infection, unspecified: Secondary | ICD-10-CM

## 2014-09-17 LAB — POCT URINALYSIS DIP (DEVICE)
BILIRUBIN URINE: NEGATIVE
Glucose, UA: NEGATIVE mg/dL
Hgb urine dipstick: NEGATIVE
KETONES UR: NEGATIVE mg/dL
NITRITE: NEGATIVE
Protein, ur: NEGATIVE mg/dL
Specific Gravity, Urine: 1.02 (ref 1.005–1.030)
UROBILINOGEN UA: 1 mg/dL (ref 0.0–1.0)
pH: 7 (ref 5.0–8.0)

## 2014-09-17 NOTE — Progress Notes (Signed)
Patient is doing well without complaints or concerns. FM/labor precautions reviewed

## 2014-09-24 ENCOUNTER — Encounter: Payer: Self-pay | Admitting: Family Medicine

## 2014-09-28 ENCOUNTER — Encounter (HOSPITAL_COMMUNITY): Payer: Self-pay

## 2014-09-28 ENCOUNTER — Inpatient Hospital Stay (HOSPITAL_COMMUNITY)
Admission: AD | Admit: 2014-09-28 | Discharge: 2014-09-28 | Disposition: A | Discharge status: Home / Self Care | Payer: Medicaid Other | Source: Ambulatory Visit | Attending: Obstetrics & Gynecology | Admitting: Obstetrics & Gynecology

## 2014-09-28 ENCOUNTER — Inpatient Hospital Stay (HOSPITAL_COMMUNITY)
Admission: AD | Admit: 2014-09-28 | Discharge: 2014-10-01 | DRG: 775 | Disposition: A | Discharge status: Home / Self Care | Payer: Medicaid Other | Source: Ambulatory Visit | Attending: Obstetrics & Gynecology | Admitting: Obstetrics & Gynecology

## 2014-09-28 ENCOUNTER — Inpatient Hospital Stay (HOSPITAL_COMMUNITY): Payer: Medicaid Other | Admitting: Anesthesiology

## 2014-09-28 ENCOUNTER — Encounter (HOSPITAL_COMMUNITY): Payer: Self-pay | Admitting: *Deleted

## 2014-09-28 DIAGNOSIS — IMO0001 Reserved for inherently not codable concepts without codable children: Secondary | ICD-10-CM

## 2014-09-28 DIAGNOSIS — Z3A39 39 weeks gestation of pregnancy: Secondary | ICD-10-CM

## 2014-09-28 DIAGNOSIS — Z87891 Personal history of nicotine dependence: Secondary | ICD-10-CM | POA: Diagnosis not present

## 2014-09-28 DIAGNOSIS — Z8249 Family history of ischemic heart disease and other diseases of the circulatory system: Secondary | ICD-10-CM | POA: Diagnosis not present

## 2014-09-28 DIAGNOSIS — A749 Chlamydial infection, unspecified: Secondary | ICD-10-CM

## 2014-09-28 DIAGNOSIS — O471 False labor at or after 37 completed weeks of gestation: Secondary | ICD-10-CM | POA: Diagnosis present

## 2014-09-28 DIAGNOSIS — O98212 Gonorrhea complicating pregnancy, second trimester: Secondary | ICD-10-CM

## 2014-09-28 DIAGNOSIS — O98812 Other maternal infectious and parasitic diseases complicating pregnancy, second trimester: Secondary | ICD-10-CM

## 2014-09-28 HISTORY — DX: Personal history of other infectious and parasitic diseases: Z86.19

## 2014-09-28 HISTORY — DX: Personal history of nicotine dependence: Z87.891

## 2014-09-28 LAB — CBC
HEMATOCRIT: 35.3 % — AB (ref 36.0–46.0)
Hemoglobin: 11.7 g/dL — ABNORMAL LOW (ref 12.0–15.0)
MCH: 28.7 pg (ref 26.0–34.0)
MCHC: 33.1 g/dL (ref 30.0–36.0)
MCV: 86.5 fL (ref 78.0–100.0)
PLATELETS: 199 10*3/uL (ref 150–400)
RBC: 4.08 MIL/uL (ref 3.87–5.11)
RDW: 13.1 % (ref 11.5–15.5)
WBC: 12.2 10*3/uL — ABNORMAL HIGH (ref 4.0–10.5)

## 2014-09-28 LAB — TYPE AND SCREEN
ABO/RH(D): A POS
Antibody Screen: NEGATIVE

## 2014-09-28 LAB — POCT FERN TEST: POCT Fern Test: POSITIVE

## 2014-09-28 LAB — OB RESULTS CONSOLE GBS: STREP GROUP B AG: NEGATIVE

## 2014-09-28 MED ORDER — PHENYLEPHRINE 40 MCG/ML (10ML) SYRINGE FOR IV PUSH (FOR BLOOD PRESSURE SUPPORT)
80.0000 ug | PREFILLED_SYRINGE | INTRAVENOUS | Status: DC | PRN
  Filled 2014-09-28: qty 2

## 2014-09-28 MED ORDER — EPHEDRINE 5 MG/ML INJ
10.0000 mg | INTRAVENOUS | Status: DC | PRN
  Filled 2014-09-28: qty 2

## 2014-09-28 MED ORDER — OXYCODONE-ACETAMINOPHEN 5-325 MG PO TABS: 2.0000 | ORAL_TABLET | ORAL | Status: DC | PRN

## 2014-09-28 MED ORDER — LACTATED RINGERS IV SOLN
500.0000 mL | INTRAVENOUS | Status: DC | PRN
  Administered 2014-09-28: 1000 mL via INTRAVENOUS

## 2014-09-28 MED ORDER — DIPHENHYDRAMINE HCL 50 MG/ML IJ SOLN: 12.5000 mg | INTRAMUSCULAR | Status: DC | PRN

## 2014-09-28 MED ORDER — LIDOCAINE HCL (PF) 1 % IJ SOLN
30.0000 mL | INTRAMUSCULAR | Status: DC | PRN
Start: 2014-09-28 — End: 2014-09-29
  Filled 2014-09-28: qty 30

## 2014-09-28 MED ORDER — ACETAMINOPHEN 325 MG PO TABS: 650.0000 mg | ORAL_TABLET | ORAL | Status: DC | PRN

## 2014-09-28 MED ORDER — LACTATED RINGERS IV SOLN
INTRAVENOUS | Status: DC
  Administered 2014-09-28 (×2): via INTRAVENOUS

## 2014-09-28 MED ORDER — CITRIC ACID-SODIUM CITRATE 334-500 MG/5ML PO SOLN: 30.0000 mL | ORAL | Status: DC | PRN

## 2014-09-28 MED ORDER — OXYCODONE-ACETAMINOPHEN 5-325 MG PO TABS
2.0000 | ORAL_TABLET | Freq: Once | ORAL | Status: AC
  Administered 2014-09-28: 2 via ORAL
  Filled 2014-09-28: qty 2

## 2014-09-28 MED ORDER — LIDOCAINE HCL (PF) 1 % IJ SOLN
INTRAMUSCULAR | Status: DC | PRN
  Administered 2014-09-28 (×2): 5 mL

## 2014-09-28 MED ORDER — PHENYLEPHRINE 40 MCG/ML (10ML) SYRINGE FOR IV PUSH (FOR BLOOD PRESSURE SUPPORT)
80.0000 ug | PREFILLED_SYRINGE | INTRAVENOUS | Status: DC | PRN
  Filled 2014-09-28: qty 2
  Filled 2014-09-28: qty 20

## 2014-09-28 MED ORDER — OXYTOCIN BOLUS FROM INFUSION
500.0000 mL | INTRAVENOUS | Status: DC
  Administered 2014-09-29: 500 mL via INTRAVENOUS

## 2014-09-28 MED ORDER — FENTANYL 2.5 MCG/ML BUPIVACAINE 1/10 % EPIDURAL INFUSION (WH - ANES)
14.0000 mL/h | INTRAMUSCULAR | Status: DC | PRN
  Administered 2014-09-28: 14 mL/h via EPIDURAL
  Filled 2014-09-28: qty 125

## 2014-09-28 MED ORDER — FLEET ENEMA 7-19 GM/118ML RE ENEM: 1.0000 | ENEMA | RECTAL | Status: DC | PRN

## 2014-09-28 MED ORDER — LACTATED RINGERS IV SOLN
500.0000 mL | Freq: Once | INTRAVENOUS | Status: AC
  Administered 2014-09-28: 500 mL via INTRAVENOUS

## 2014-09-28 MED ORDER — OXYTOCIN 40 UNITS IN LACTATED RINGERS INFUSION - SIMPLE MED
62.5000 mL/h | INTRAVENOUS | Status: DC
  Filled 2014-09-28 (×2): qty 1000

## 2014-09-28 MED ORDER — ONDANSETRON HCL 4 MG/2ML IJ SOLN: 4.0000 mg | Freq: Four times a day (QID) | INTRAMUSCULAR | Status: DC | PRN

## 2014-09-28 MED ORDER — OXYCODONE-ACETAMINOPHEN 5-325 MG PO TABS: 1.0000 | ORAL_TABLET | ORAL | Status: DC | PRN

## 2014-09-28 MED ORDER — EPHEDRINE 5 MG/ML INJ
10.0000 mg | INTRAVENOUS | Status: DC | PRN
Start: 2014-09-28 — End: 2014-09-29
  Filled 2014-09-28: qty 2

## 2014-09-28 NOTE — Anesthesia Procedure Notes (Signed)
Epidural Patient location during procedure: OB Start time: 09/28/2014 10:07 PM  Staffing Anesthesiologist: Brayton CavesJACKSON, Aayansh Codispoti Performed by: anesthesiologist   Preanesthetic Checklist Completed: patient identified, site marked, surgical consent, pre-op evaluation, timeout performed, IV checked, risks and benefits discussed and monitors and equipment checked  Epidural Patient position: sitting Prep: site prepped and draped and DuraPrep Patient monitoring: continuous pulse ox and blood pressure Approach: midline Location: L3-L4 Injection technique: LOR air  Needle:  Needle type: Tuohy  Needle gauge: 17 G Needle length: 9 cm and 9 Needle insertion depth: 5 cm cm Catheter type: closed end flexible Catheter size: 19 Gauge Catheter at skin depth: 10 cm Test dose: negative  Assessment Events: blood not aspirated, injection not painful, no injection resistance, negative IV test and no paresthesia  Additional Notes Patient identified.  Risk benefits discussed including failed block, incomplete pain control, headache, nerve damage, paralysis, blood pressure changes, nausea, vomiting, reactions to medication both toxic or allergic, and postpartum back pain.  Patient expressed understanding and wished to proceed.  All questions were answered.  Sterile technique used throughout procedure and epidural site dressed with sterile barrier dressing. No paresthesia or other complications noted.The patient did not experience any signs of intravascular injection such as tinnitus or metallic taste in mouth nor signs of intrathecal spread such as rapid motor block. Please see nursing notes for vital signs.

## 2014-09-28 NOTE — MAU Note (Signed)
Contractions every 10 minutes since 4 am. Denies LOF or vaginal bleeding. Positive fetal movement.

## 2014-09-28 NOTE — Progress Notes (Signed)
Dr Caroleen Hammanumley notified notified of pt's VE, FHR pattern, pt up to ambulate.

## 2014-09-28 NOTE — Anesthesia Preprocedure Evaluation (Signed)

## 2014-09-28 NOTE — H&P (Signed)
Sara Singleton is a 24 y.o. female 39.3wks presenting for Labor having contractions that started at 0400 this morning and has gotten progressively stronger throughout day..GBS neg. Maternal Medical History:  Reason for admission: Contractions.   Contractions: Onset was 13-24 hours ago.   Frequency: regular.   Perceived severity is moderate.    Fetal activity: Perceived fetal activity is normal.   Last perceived fetal movement was within the past hour.      OB History    Gravida Para Term Preterm AB TAB SAB Ectopic Multiple Living   1              Past Medical History  Diagnosis Date  . Medical history non-contributory    Past Surgical History  Procedure Laterality Date  . No past surgeries     Family History: family history includes Hypertension in her father and mother. Social History:  reports that she quit smoking about 7 months ago. She does not have any smokeless tobacco history on file. She reports that she does not drink alcohol or use illicit drugs.   Prenatal Transfer Tool  Maternal Diabetes: No Genetic Screening: Normal Maternal Ultrasounds/Referrals: Normal Fetal Ultrasounds or other Referrals:  None Maternal Substance Abuse:  No Significant Maternal Medications:  None Significant Maternal Lab Results:  POS gc and Chla in 2nd trimester treated Other Comments:  None  Review of Systems  Constitutional: Negative.   HENT: Negative.   Eyes: Negative.   Respiratory: Negative.   Cardiovascular: Negative.   Gastrointestinal: Positive for abdominal pain.  Genitourinary: Negative.   Musculoskeletal: Negative.   Skin: Negative.   Neurological: Negative.   Endo/Heme/Allergies: Negative.   Psychiatric/Behavioral: Negative.     Dilation: 4 Effacement (%): 80 Station: -2 Exam by:: Elie ConferK. Weiss RN Blood pressure 144/82, pulse 108, temperature 98.2 F (36.8 C), temperature source Oral, resp. rate 20, last menstrual period 01/05/2014, SpO2 100 %. Maternal Exam:   Uterine Assessment: Contraction strength is moderate.  Contraction frequency is regular.   Abdomen: Patient reports no abdominal tenderness. Fetal presentation: vertex  Introitus: Normal vulva.   Fetal Exam Fetal Monitor Review: Mode: ultrasound.   Variability: moderate (6-25 bpm).   Pattern: accelerations present.    Fetal State Assessment: Category I - tracings are normal.     Physical Exam  Constitutional: She is oriented to person, place, and time. She appears well-developed and well-nourished.  HENT:  Head: Normocephalic.  Eyes: Pupils are equal, round, and reactive to light.  Neck: Normal range of motion.  Cardiovascular: Normal rate, regular rhythm, normal heart sounds and intact distal pulses.   Respiratory: Effort normal and breath sounds normal.  GI: Soft. Bowel sounds are normal.  Genitourinary: Vagina normal.  Musculoskeletal: Normal range of motion.  Neurological: She is alert and oriented to person, place, and time. She has normal reflexes.  Skin: Skin is warm and dry.  Psychiatric: She has a normal mood and affect. Her behavior is normal. Judgment and thought content normal.    Prenatal labs: ABO, Rh: A/POS/-- (12/10 1220) Antibody: NEG (12/10 1220) Rubella: 1.19 (12/10 1220) RPR: NON REAC (01/07 1511)  HBsAg: NEGATIVE (12/10 1220)  HIV: NONREACTIVE (12/10 1220)  GBS: Negative (03/07 0000)   Assessment/Plan: Early labor, ambulate if further cervical change to admit.   Sara Singleton 09/28/2014, 8:10 PM

## 2014-09-28 NOTE — Discharge Instructions (Signed)

## 2014-09-28 NOTE — MAU Note (Signed)
Pt presents with worsening contractions.  

## 2014-09-29 ENCOUNTER — Encounter (HOSPITAL_COMMUNITY): Payer: Self-pay | Admitting: *Deleted

## 2014-09-29 DIAGNOSIS — Z3A39 39 weeks gestation of pregnancy: Secondary | ICD-10-CM

## 2014-09-29 LAB — ABO/RH: ABO/RH(D): A POS

## 2014-09-29 MED ORDER — SENNOSIDES-DOCUSATE SODIUM 8.6-50 MG PO TABS
2.0000 | ORAL_TABLET | ORAL | Status: DC
  Administered 2014-09-29 – 2014-09-30 (×2): 2 via ORAL
  Filled 2014-09-29 (×2): qty 2

## 2014-09-29 MED ORDER — ZOLPIDEM TARTRATE 5 MG PO TABS: 5.0000 mg | ORAL_TABLET | Freq: Every evening | ORAL | Status: DC | PRN

## 2014-09-29 MED ORDER — ONDANSETRON HCL 4 MG PO TABS: 4.0000 mg | ORAL_TABLET | ORAL | Status: DC | PRN

## 2014-09-29 MED ORDER — BENZOCAINE-MENTHOL 20-0.5 % EX AERO
1.0000 "application " | INHALATION_SPRAY | CUTANEOUS | Status: DC | PRN
  Administered 2014-09-29: 1 via TOPICAL
  Filled 2014-09-29: qty 56

## 2014-09-29 MED ORDER — IBUPROFEN 600 MG PO TABS
600.0000 mg | ORAL_TABLET | Freq: Four times a day (QID) | ORAL | Status: DC
  Administered 2014-09-29 – 2014-10-01 (×10): 600 mg via ORAL
  Filled 2014-09-29 (×10): qty 1

## 2014-09-29 MED ORDER — OXYCODONE-ACETAMINOPHEN 5-325 MG PO TABS: 2.0000 | ORAL_TABLET | ORAL | Status: DC | PRN

## 2014-09-29 MED ORDER — DIPHENHYDRAMINE HCL 25 MG PO CAPS: 25.0000 mg | ORAL_CAPSULE | Freq: Four times a day (QID) | ORAL | Status: DC | PRN

## 2014-09-29 MED ORDER — OXYCODONE-ACETAMINOPHEN 5-325 MG PO TABS: 1.0000 | ORAL_TABLET | ORAL | Status: DC | PRN

## 2014-09-29 MED ORDER — PRENATAL MULTIVITAMIN CH
1.0000 | ORAL_TABLET | Freq: Every day | ORAL | Status: DC
  Administered 2014-09-29 – 2014-10-01 (×3): 1 via ORAL
  Filled 2014-09-29 (×3): qty 1

## 2014-09-29 MED ORDER — ONDANSETRON HCL 4 MG/2ML IJ SOLN: 4.0000 mg | INTRAMUSCULAR | Status: DC | PRN

## 2014-09-29 MED ORDER — TETANUS-DIPHTH-ACELL PERTUSSIS 5-2.5-18.5 LF-MCG/0.5 IM SUSP: 0.5000 mL | Freq: Once | INTRAMUSCULAR | Status: DC

## 2014-09-29 MED ORDER — WITCH HAZEL-GLYCERIN EX PADS: 1.0000 "application " | MEDICATED_PAD | CUTANEOUS | Status: DC | PRN

## 2014-09-29 MED ORDER — DIBUCAINE 1 % RE OINT: 1.0000 "application " | TOPICAL_OINTMENT | RECTAL | Status: DC | PRN

## 2014-09-29 MED ORDER — LANOLIN HYDROUS EX OINT: TOPICAL_OINTMENT | CUTANEOUS | Status: DC | PRN

## 2014-09-29 MED ORDER — SIMETHICONE 80 MG PO CHEW: 80.0000 mg | CHEWABLE_TABLET | ORAL | Status: DC | PRN

## 2014-09-29 NOTE — Anesthesia Postprocedure Evaluation (Signed)
  Anesthesia Post-op Note  Patient: Sara ProseBrenda Gebbia  Procedure(s) Performed: * No procedures listed *  Patient Location: Mother/Baby  Anesthesia Type:Epidural  Level of Consciousness: awake  Airway and Oxygen Therapy: Patient Spontanous Breathing  Post-op Pain: mild  Post-op Assessment: Patient's Cardiovascular Status Stable and Respiratory Function Stable  Post-op Vital Signs: stable  Last Vitals:  Filed Vitals:   09/29/14 0545  BP: 117/62  Pulse: 92  Temp: 36.8 C  Resp: 18    Complications: No apparent anesthesia complications

## 2014-09-29 NOTE — Lactation Note (Signed)
This note was copied from the chart of Sara Donald ProseBrenda Voytko. Lactation Consultation Note New mom states BF going well. Had a lot of company, and stated hasn't been long since she BF baby. States Statisticianbaby latches well most of time, will occasionally slip off to the end of her nipple and it will hurt. Discussed deep latches verses shallow latches. Positioning, supply and demand, monitoring I&O. Mom encouraged to feed baby 8-12 times/24 hours and with feeding cues. Mom encouraged to waken baby for feeds. Encouraged to call for assistance if needed and to verify proper latch. Mom encouraged to do skin-to-skin. Referred to Baby and Me Book in Breastfeeding section Pg. 22-23 for position options and Proper latch demonstration. WH/LC brochure given w/resources, support groups and LC services. Educated about newborn behavior.  Patient Name: Sara Donald ProseBrenda Spanier ZOXWR'UToday's Date: 09/29/2014 Reason for consult: Initial assessment   Maternal Data    Feeding Feeding Type: Breast Fed Length of feed: 15 min  LATCH Score/Interventions                      Lactation Tools Discussed/Used     Consult Status Consult Status: Follow-up Date: 09/30/14 Follow-up type: In-patient    Charyl DancerCARVER, Kaydan Wilhoite G 09/29/2014, 1:06 PM

## 2014-09-29 NOTE — Progress Notes (Signed)
UR chart review completed.  

## 2014-09-29 NOTE — Progress Notes (Signed)
Sara Singleton is a 24 y.o. G1P0 at 6256w4d by 28 week u/s admitted for active labor  Subjective: Pt comfortable with epidural.  Family in room for support.  Objective: BP 125/76 mmHg  Pulse 117  Temp(Src) 98.2 F (36.8 C) (Oral)  Resp 18  Ht 5\' 8"  (1.727 m)  Wt 165 lb (74.844 kg)  BMI 25.09 kg/m2  SpO2 100%  LMP 01/05/2014 (Approximate)      FHT:  FHR: 135 bpm, variability: moderate with periods of minimal,  accelerations:  Present,  decelerations:  Absent UC:   regular, every 2-3 minutes, moderate to palpation SVE:   Dilation: 6 Effacement (%): 100 Station: 0 Exam by:: L. Leftwich-Kirby, CNM Forebag, BBOW palpable  Labs: Lab Results  Component Value Date   WBC 12.2* 09/28/2014   HGB 11.7* 09/28/2014   HCT 35.3* 09/28/2014   MCV 86.5 09/28/2014   PLT 199 09/28/2014    Assessment / Plan: Spontaneous labor, progressing normally  Labor: Progressing normally.  Pt comfortable, but feeling some contractions with epidural.  Position changed, attempt to obtain more comfort before augmentation, if needed, for labor progression Preeclampsia:  n/a Fetal Wellbeing:  Category I Pain Control:  Epidural I/D:  n/a Anticipated MOD:  NSVD  LEFTWICH-KIRBY, Sara Singleton 09/29/2014, 12:06 AM

## 2014-09-30 LAB — CBC
HEMATOCRIT: 30.8 % — AB (ref 36.0–46.0)
Hemoglobin: 9.8 g/dL — ABNORMAL LOW (ref 12.0–15.0)
MCH: 28.2 pg (ref 26.0–34.0)
MCHC: 31.8 g/dL (ref 30.0–36.0)
MCV: 88.5 fL (ref 78.0–100.0)
PLATELETS: 169 10*3/uL (ref 150–400)
RBC: 3.48 MIL/uL — ABNORMAL LOW (ref 3.87–5.11)
RDW: 13.2 % (ref 11.5–15.5)
WBC: 11.4 10*3/uL — ABNORMAL HIGH (ref 4.0–10.5)

## 2014-09-30 LAB — RPR: RPR: NONREACTIVE

## 2014-09-30 MED ORDER — IBUPROFEN 600 MG PO TABS: 600.0000 mg | ORAL_TABLET | Freq: Four times a day (QID) | ORAL | Status: DC

## 2014-09-30 NOTE — Discharge Instructions (Signed)

## 2014-09-30 NOTE — Discharge Summary (Signed)
Obstetric Discharge Summary Reason for Admission: onset of labor Prenatal Procedures: none Intrapartum Procedures: spontaneous vaginal delivery Postpartum Procedures: none Complications-Operative and Postpartum: Second degree perineal laceration  Delivery Note At 2:18 AM a healthy female was delivered via Vaginal, Spontaneous Delivery (Presentation: ; Occiput Anterior).  APGAR: 8, 9; weight 6 lb 5.9 oz (2889 g).   Placenta status: Intact, Spontaneous.  Cord: 3 vessels with the following complications: None.   Anesthesia: Epidural  Episiotomy: None Lacerations: 2nd degree;Perineal Suture Repair: 3.0 vicryl Est. Blood Loss (mL): 300  Mom to postpartum.  Baby to Couplet care / Skin to Skin.  Hospital Course:  Active Problems:   Active labor   NSVD (normal spontaneous vaginal delivery)   Donald ProseBrenda Brisbon is a 24 y.o. G1P1001 s/p SVD.  Patient was admitted to L&D.  She has postpartum course that was uncomplicated including no problems with ambulating, PO intake, urination, pain, or bleeding. The pt feels ready to go home and  will be discharged with outpatient follow-up.   Today: No acute events overnight.  Pt denies problems with ambulating, voiding or po intake.  She denies nausea or vomiting.  Pain is well controlled.  She has had flatus. She has not had bowel movement.  Lochia Minimal.  Plan for birth control is  Nexplanon.  Method of Feeding: Breast   H/H: Lab Results  Component Value Date/Time   HGB 9.8* 09/30/2014 07:40 AM   HCT 30.8* 09/30/2014 07:40 AM    Discharge Diagnoses: Term Pregnancy-delivered  Discharge Information: Date: 09/30/2014 Activity: pelvic rest Diet: routine  Medications: Ibuprofen Breast feeding:  Yes Condition: stable Instructions: refer to handout Discharge to: home    Medication List    TAKE these medications        ibuprofen 600 MG tablet  Commonly known as:  ADVIL,MOTRIN  Take 1 tablet (600 mg total) by mouth every 6 (six) hours.     Prenatal Vitamins 28-0.8 MG Tabs  Take 1 tablet by mouth daily.           Follow-up Information    [facility]   Specialty:  Obstetrics and Gynecology   Contact information:   [address] [place] [id number] [phone]      Araceli Boucheumley, [place] N, DO Family Medicine, PGY-1 09/30/2014,8:56 AM

## 2014-09-30 NOTE — Lactation Note (Signed)
This note was copied from the chart of Sara Singleton [place]. Lactation Consultation Note: Mother has a positional strip and a crack on the Left nipple. She has a small bruise and scab on the Right nipple, Assist mother with latching infant on the right breast in cross cradle hold. Mother complaints of pain scale of #2 on the initial latch. Adjust infants lower jaw for wider gape and mother states pain relieved. Observed 15-20 mins of suckling and a few swallows. Observed mothers nipple pinched with a blanched ridge at the top of the nipple. Observed that infant does have an anterior frenula,. Mother advised to unlatch infant with pain and relatch with good depth. Informed Lafonda Mossesonna [facility] staff nurse to have Peds to evaluate infants tongue . Mother was given comfort gels. Advised mother to page for assistance as needed. She has good flow of colostrum. Discussed need to feed infant 8-12 times in 24 hours. Advised mother that cluster feeding is expected. Mother receptive to all teaching.   Patient Name: Sara Singleton [place] ZOXWR'UToday's Date: 09/30/2014 Reason for consult: Follow-up assessment   Maternal Data    Feeding Feeding Type: Breast Fed Length of feed: 15 min  LATCH Score/Interventions Latch: Grasps breast easily, tongue down, lips flanged, rhythmical sucking. Intervention(s): Adjust position;Assist with latch;Breast compression  Audible Swallowing: A few with stimulation Intervention(s): Skin to skin;Hand expression  Type of Nipple: Everted at rest and after stimulation  Comfort (Breast/Nipple): Engorged, cracked, bleeding, large blisters, severe discomfort (bilateral positional strips) Problem noted: Cracked, bleeding, blisters, bruises Intervention(s): Expressed breast milk to nipple     Hold (Positioning): Assistance needed to correctly position infant at breast and maintain latch. Intervention(s): Support Pillows;Position options;Skin to skin  LATCH Score: 6  Lactation Tools  Discussed/Used     Consult Status Consult Status: Follow-up Date: 09/30/14 Follow-up type: In-patient    Stevan BornKendrick, Dorinda Stehr [facility] 09/30/2014, 2:13 PM

## 2014-09-30 NOTE — Progress Notes (Signed)
Clinical Social Work Department PSYCHOSOCIAL ASSESSMENT - MATERNAL/CHILD 09/30/2014  Patient:  Sara Singleton, Sara Singleton  Account Number:  [id number]  Admit Date:  09/28/2014  Marjo Bicker Name:   Rushie Nyhan   Clinical Social Worker:  Loleta Books, CLINICAL SOCIAL WORKER   Date/Time:  09/30/2014 11:00 AM  Date Referred:  09/29/2014   Referral source  Central Nursery     Referred reason  Psychosocial assessment   Other referral source:    I:  FAMILY / HOME ENVIRONMENT Child's legal guardian:  PARENT  Guardian - Name Guardian - Age Guardian - Address  Katyra Tomassetti [address] [place], [place] [address]   Other household support members/support persons Name Relationship DOB   MOTHER    Other support:   MOB endorsed strong family support. She stated that she lives with her mother and she is highly supportive.    II  PSYCHOSOCIAL DATA Information Source:  Patient Interview  Event organiser Employment:   MOB stated that she is currently not working. She stated that intends to enroll in school with [employer] in the near future.   Financial resources:  Medicaid If Medicaid - County:  [place] Other  [facility]   School / Grade:  N/A Government social research officer / Child Services Coordination / Early Interventions:   None reported  Cultural issues impacting care:   None reported    III  STRENGTHS Strengths  Adequate Resources  Home prepared for Child (including basic supplies)  Supportive family/friends   Strength comment:    IV  RISK FACTORS AND CURRENT PROBLEMS Current Problem:  YES   Risk Factor & Current Problem Patient Issue Family Issue Risk Factor / Current Problem Comment  Other - See comment Y N MOB was incarcerated for 7 months of this pregnancy. MOB was released from jail on 3/7, and the infant was born on 3/8.  MOB currently has an ankle bracelet and is currently on probation.    V  SOCIAL WORK ASSESSMENT CSW received consult due to recent  incarceration.  Upon arrival, MOB was alone in the room.  She was agreeable for CSW visit, but was closed, vague, and guarded.  MOB displayed an appropriate range in affect and was respectful during the visit.  She smiled frequently as she looked at the infant, and was noted to be breastfeeding the infant during the entire visit.   MOB denied questions, concerns, or needs as she transitions into the postpartum period. She discussed strong family support, and reflected upon how her mother is excited to be a grandmother and has prepared their home for the infant's arrival. She also endorsed strong support from the FOB, but was vague about the details of him, including where he lives.  She stated, "he lives here and there", but confirmed that he does not live in their home.  She stated that the MGM works during the day, but she shared belief that she has other family members who she can call if she needs help during the day.   MOB denied a mental health history or mood symptoms during the pregnancy.  She appeared attentive and engaged as CSW provided education on postpartum depression, and agreed to contact her medical provider if she experiences onset of symptoms.   MOB denied any other presence of stressors that may impact her transition to the postpartum.  Since MOB made no mention of stressors, CSW inquired about recent incarceration.  She was then noted to avoid eye contact. MOB endorsed incarceration  for "most of the pregnancy", and stated that she was incarcerated when she was 2 months pregnant and was then released on 3/7.  She shared that she is thankful that the infant arrived after she was released since she did not want to have a guard present during the delivery.  MOB was vague about the events that led to her incarceration, and only reported that she was only arrested since she was around the two other people who committed the crime. She endorsed belief that she was in the "wrong place at the wrong  time" and indicated being guilty by association.  She stated that due to not actively engaging in the crime, she was released from jail under a one year probation with an ankle bracelet. She discussed that she intends to stay at home with the infant and will work closely with her probation officer.  The MOB did not strongly identify with any feelings associated with being incarcerated during a pregnancy, and denied feeling stressed/overwhelmed about ongoing legal involvement.  MOB shared that her mother helped ensure that their home was prepared for the infant's arrival, and MOB denied any ongoing adjustment to recent release.    MOB denied additional questions, concerns, or needs at this time. She stated that she feels well supported, and acknowledged ongoing CSW availability if needed.    VI SOCIAL WORK PLAN Social Work Personnel officerlan  Patient/Family Education  Child Protective Services Report   Type of pt/family education:   Postpartum depression   If child protective services report - county:  [place] If child protective services report - date:  09/30/2014 Information/referral to community resources comment:   [facility]   Other social work plan:   CSW will follow up with CPS to determine status of the report and to receive discharge recommendations.    CSW will continue to follow.

## 2014-10-01 ENCOUNTER — Encounter: Payer: Self-pay | Admitting: Obstetrics & Gynecology

## 2014-10-01 NOTE — Lactation Note (Signed)
This note was copied from the chart of Boy Donald ProseBrenda Butrum. Lactation Consultation Note: Follow up visit with mom before DC. Mom has baby latched to the breast when I went into room. Mom reports that baby has been nursing for 15 min. Assisted mom with pillows for support. Reports breasts are feeling fuller this morning. Reports right breast that he is nursing on is feeling softer. Mom reports her mother bought her an pump for home use. Reviewed engorgement prevention and treatment. No questions at present. To call prn  Patient Name: Boy Donald ProseBrenda Sharpnack ZOXWR'UToday's Date: 10/01/2014 Reason for consult: Follow-up assessment   Maternal Data Formula Feeding for Exclusion: No Has patient been taught Hand Expression?: Yes Does the patient have breastfeeding experience prior to this delivery?: No  Feeding Feeding Type: Breast Fed Length of feed: 30 min  LATCH Score/Interventions Latch: Grasps breast easily, tongue down, lips flanged, rhythmical sucking.  Audible Swallowing: A few with stimulation  Type of Nipple: Everted at rest and after stimulation  Comfort (Breast/Nipple): Filling, red/small blisters or bruises, mild/mod discomfort  Problem noted: Filling  Hold (Positioning): No assistance needed to correctly position infant at breast. Intervention(s): Support Pillows  LATCH Score: 8  Lactation Tools Discussed/Used     Consult Status Consult Status: Complete    Pamelia HoitWeeks, Amiylah Anastos D 10/01/2014, 8:18 AM

## 2014-10-01 NOTE — Progress Notes (Signed)
[facility] that the CPS report that was made on 3/9 has been screened out.   No barriers to discharge.

## 2014-10-01 NOTE — Discharge Summary (Signed)
Obstetric Discharge Summary Reason for Admission: onset of labor Prenatal Procedures: none Intrapartum Procedures: spontaneous vaginal delivery Postpartum Procedures: none Complications-Operative and Postpartum: Second degree perineal laceration  Delivery Note At 2:18 AM a healthy female was delivered via Vaginal, Spontaneous Delivery (Presentation: ; Occiput Anterior).  APGAR: 8, 9; weight 6 lb 5.9 oz (2889 g).   Placenta status: Intact, Spontaneous.  Cord: 3 vessels with the following complications: None.   Anesthesia: Epidural  Episiotomy: None Lacerations: 2nd degree;Perineal Suture Repair: 3.0 vicryl Est. Blood Loss (mL): 300  Mom to postpartum.  Baby to Couplet care / Skin to Skin.  Hospital Course:  Active Problems:   Active labor   NSVD (normal spontaneous vaginal delivery)   Sara Singleton is a 24 y.o. G1P1001 s/p SVD.  Patient was admitted to L&D.  She has postpartum course that was uncomplicated including no problems with ambulating, PO intake, urination, pain, or bleeding. The pt feels ready to go home and  will be discharged with outpatient follow-up. Infant plans are being determined by SW/CPS as pt is on house arrest.  Today: No acute events overnight.  Pt denies problems with ambulating, voiding or po intake.  She denies nausea or vomiting.  Pain is well controlled.  She has had flatus. She has had bowel movement.  Lochia Minimal.  Plan for birth control is  Nexplanon.  Method of Feeding: Breast   H/H: Lab Results  Component Value Date/Time   HGB 9.8* 09/30/2014 07:40 AM   HCT 30.8* 09/30/2014 07:40 AM    Discharge Diagnoses: Term Pregnancy-delivered  Discharge Information: Date: 10/01/2014 Activity: pelvic rest Diet: routine  Medications: Ibuprofen Breast feeding:  Yes Condition: stable Instructions: refer to handout Discharge to: home    Medication List    TAKE these medications        ibuprofen 600 MG tablet  Commonly known as:  ADVIL,MOTRIN   Take 1 tablet (600 mg total) by mouth every 6 (six) hours.     Prenatal Vitamins 28-0.8 MG Tabs  Take 1 tablet by mouth daily.       Follow-up Information    [facility]   Specialty:  Obstetrics and Gynecology   Contact information:   [address] [place] [id number] [phone]      Araceli Boucheumley, [place] N, DO Family Medicine, PGY-1 10/01/2014,9:11 AM   I have seen and examined this patient and I agree with the above. Cam HaiSHAW, KIMBERLY CNM 9:41 AM 10/01/2014

## 2014-11-12 ENCOUNTER — Ambulatory Visit (INDEPENDENT_AMBULATORY_CARE_PROVIDER_SITE_OTHER): Payer: Medicaid Other | Admitting: Advanced Practice Midwife

## 2014-11-12 ENCOUNTER — Encounter: Payer: Self-pay | Admitting: Advanced Practice Midwife

## 2014-11-12 VITALS — BP 102/66 | HR 89 | Temp 98.5°F | Ht 67.0 in | Wt 163.6 lb

## 2014-11-12 DIAGNOSIS — Z30011 Encounter for initial prescription of contraceptive pills: Secondary | ICD-10-CM

## 2014-11-12 DIAGNOSIS — Z8742 Personal history of other diseases of the female genital tract: Secondary | ICD-10-CM

## 2014-11-12 DIAGNOSIS — IMO0001 Reserved for inherently not codable concepts without codable children: Secondary | ICD-10-CM

## 2014-11-12 DIAGNOSIS — Z789 Other specified health status: Secondary | ICD-10-CM

## 2014-11-12 MED ORDER — NORETHINDRONE 0.35 MG PO TABS: 1.0000 | ORAL_TABLET | Freq: Every day | ORAL | Status: DC

## 2014-11-12 NOTE — Progress Notes (Signed)
  Subjective:     Sara Singleton is a 24 y.o. female who presents for a postpartum visit. She is 6 weeks postpartum following a spontaneous vaginal delivery. I have fully reviewed the prenatal and intrapartum course. The delivery was at term gestational weeks. Outcome: spontaneous vaginal delivery. Anesthesia: epidural. Postpartum course has been uneventful. Baby's course has been uneventful. Baby is feeding by breast. Bleeding no bleeding. Bowel function is normal. Bladder function is normal. Patient is not sexually active. Contraception method is oral progesterone-only contraceptive. Postpartum depression screening: negative.  The following portions of the patient's history were reviewed and updated as appropriate: allergies, current medications, past family history, past medical history, past social history, past surgical history and problem list.  Review of Systems Pertinent items are noted in HPI.   Objective:    BP 102/66 mmHg  Pulse 89  Temp(Src) 98.5 F (36.9 C) (Oral)  Ht 5\' 7"  (1.702 m)  Wt 163 lb 9.6 oz (74.208 kg)  BMI 25.62 kg/m2  General:  alert, cooperative and no distress   Breasts:  inspection negative, no nipple discharge or bleeding, no masses or nodularity palpable  Lungs: clear to auscultation bilaterally  Heart:  normal rate  Abdomen: soft, non-tender; bowel sounds normal; no masses,  no organomegaly   Vulva:  normal Laceration well healed  Vagina: normal vagina, no discharge, exudate, lesion, or erythema  Cervix:  not evaluated  Corpus: not examined  Adnexa:  no mass, fullness, tenderness  Rectal Exam: Not performed.        Assessment:     Normal postpartum exam. Pap smear not done at today's visit.   Plan:    1. Contraception: oral progesterone-only contraceptive 2. Discussed how to take pills. Rx sent 3. Follow up in: 1 year or as needed.

## 2014-11-12 NOTE — Patient Instructions (Signed)

## 2014-11-16 ENCOUNTER — Telehealth: Payer: Self-pay

## 2014-11-16 NOTE — Telephone Encounter (Signed)
Patient called stating she was seen for PP visit in 4/21 and OCPs were supposed to have been sent to her pharmacy, but pharmacy states they did not receive them. Called [facility] pharmacy [facility] [address] and confirmed that they did receive RX-- they state the did and it is ready for pick up. Called patient who reports she went to the wrong pharmacy-- will go and pick up RX. No further questions or concerns.

## 2015-05-15 ENCOUNTER — Telehealth: Payer: Self-pay | Admitting: *Deleted

## 2015-05-15 DIAGNOSIS — B379 Candidiasis, unspecified: Secondary | ICD-10-CM

## 2015-05-15 NOTE — Telephone Encounter (Signed)
Received message left on nurse line on 05/14/15 at 1059.  Patient states [facility] and they told her to contact us.  Patient states she has a yeast infection and needs medication.  Requests a return call.

## 2015-05-17 MED ORDER — FLUCONAZOLE 150 MG PO TABS: 150.0000 mg | ORAL_TABLET | Freq: Once | ORAL | Status: DC

## 2015-05-17 NOTE — Telephone Encounter (Signed)
Rx sent per protocol for diflucan. Attempted to call patient but heard message that the caller is not accepting calls at this time.

## 2015-05-27 ENCOUNTER — Encounter: Payer: Self-pay | Admitting: Advanced Practice Midwife

## 2015-05-27 ENCOUNTER — Ambulatory Visit (INDEPENDENT_AMBULATORY_CARE_PROVIDER_SITE_OTHER): Payer: Medicaid Other | Admitting: Advanced Practice Midwife

## 2015-05-27 VITALS — BP 127/74 | HR 64 | Temp 98.3°F | Ht 68.0 in | Wt 163.7 lb

## 2015-05-27 DIAGNOSIS — B373 Candidiasis of vulva and vagina: Secondary | ICD-10-CM

## 2015-05-27 DIAGNOSIS — B379 Candidiasis, unspecified: Secondary | ICD-10-CM

## 2015-05-27 MED ORDER — FLUCONAZOLE 150 MG PO TABS: 150.0000 mg | ORAL_TABLET | Freq: Once | ORAL | Status: DC

## 2015-05-27 NOTE — Patient Instructions (Signed)

## 2015-05-27 NOTE — Progress Notes (Signed)
   Subjective:    Patient ID: Donald ProseBrenda Coupland, female    DOB: 01/18/1991, 24 y.o.   MRN: [id number]  HPI This is a 24 y.o. female who presents with c/o vaginal itching. Had called about this a month ago and was prescribed Diflucan over the phone, but could not be reached by phone so never picked it up. Symptoms are unchanged. States she is abstinent and not worried about STDs.   Review of Systems  Constitutional: Negative for fever.  Gastrointestinal: Negative for abdominal pain.  Genitourinary: Positive for vaginal discharge. Negative for menstrual problem and pelvic pain.       Objective:   Physical Exam  Constitutional: She is oriented to person, place, and time. She appears well-developed and well-nourished. No distress.  HENT:  Head: Normocephalic.  Cardiovascular: Normal rate.   Pulmonary/Chest: Effort normal. No respiratory distress.  Abdominal: Soft. There is no tenderness.  Genitourinary:  Exam not indicated  Musculoskeletal: Normal range of motion.  Neurological: She is alert and oriented to person, place, and time.  Skin: Skin is warm and dry.  Psychiatric: She has a normal mood and affect.          Assessment & Plan:  A;  Yeast vaginitis  P;  Rx sent for Diflucan      Reviewed signs to report      Declines STD testing      Return to clinic PRN

## 2015-06-07 ENCOUNTER — Ambulatory Visit: Payer: Medicaid Other | Admitting: Obstetrics and Gynecology

## 2015-09-23 IMAGING — CR DG ANKLE COMPLETE 3+V*L*
3 series · 3 of 3 positions shown · non-contrast
Comparison: None

CLINICAL DATA: Fell this morning, LEFT ankle pain and swelling

EXAM:
LEFT ANKLE COMPLETE - 3+ VIEW

[view not recorded (1 of 3)]
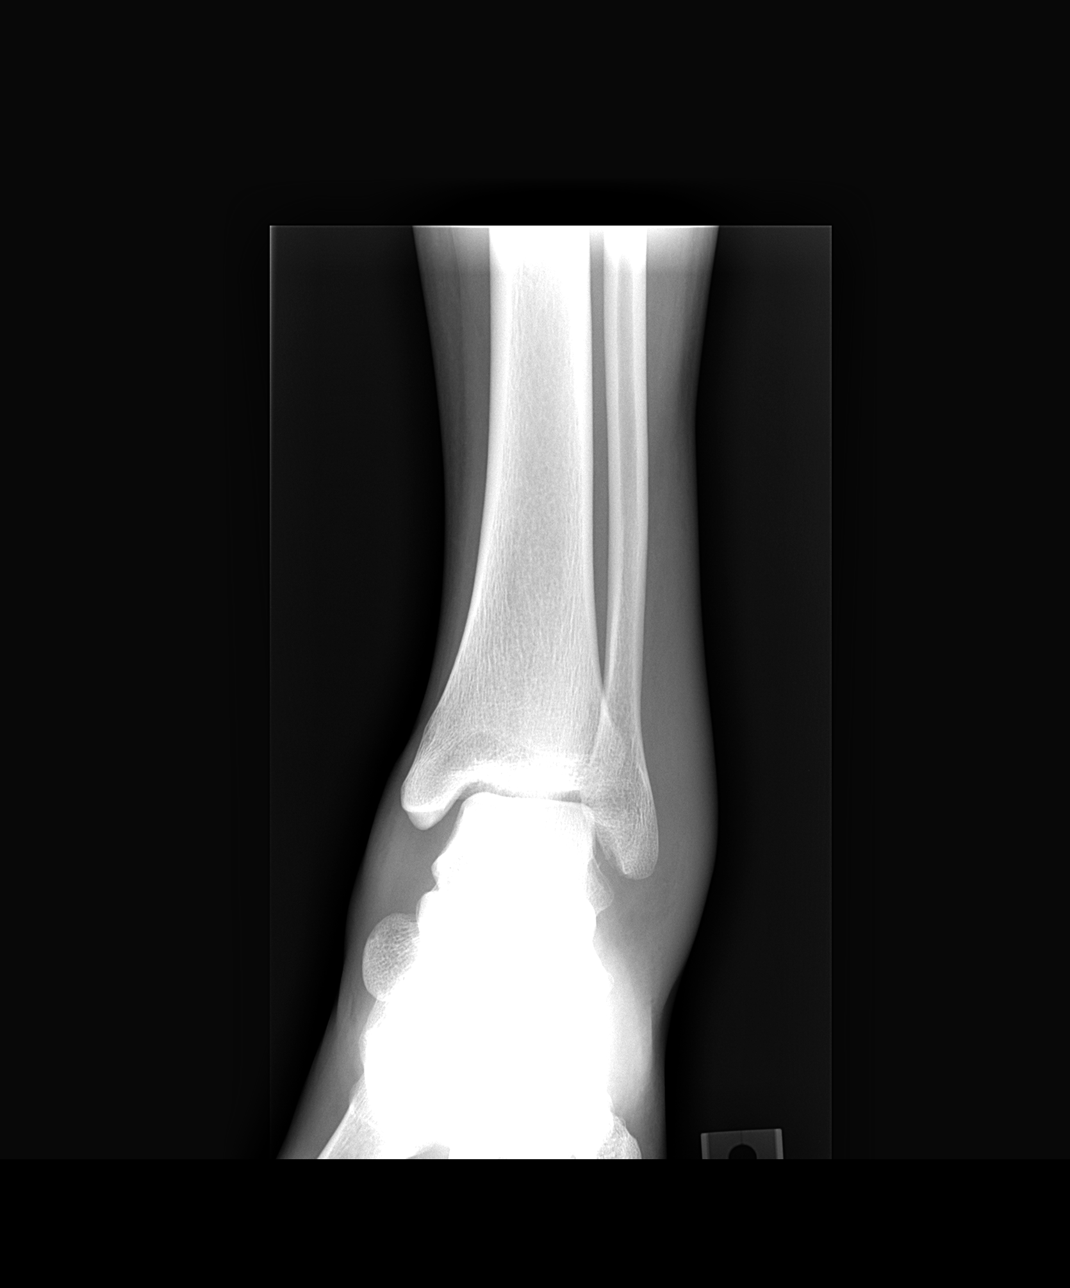

[view not recorded (2 of 3)]
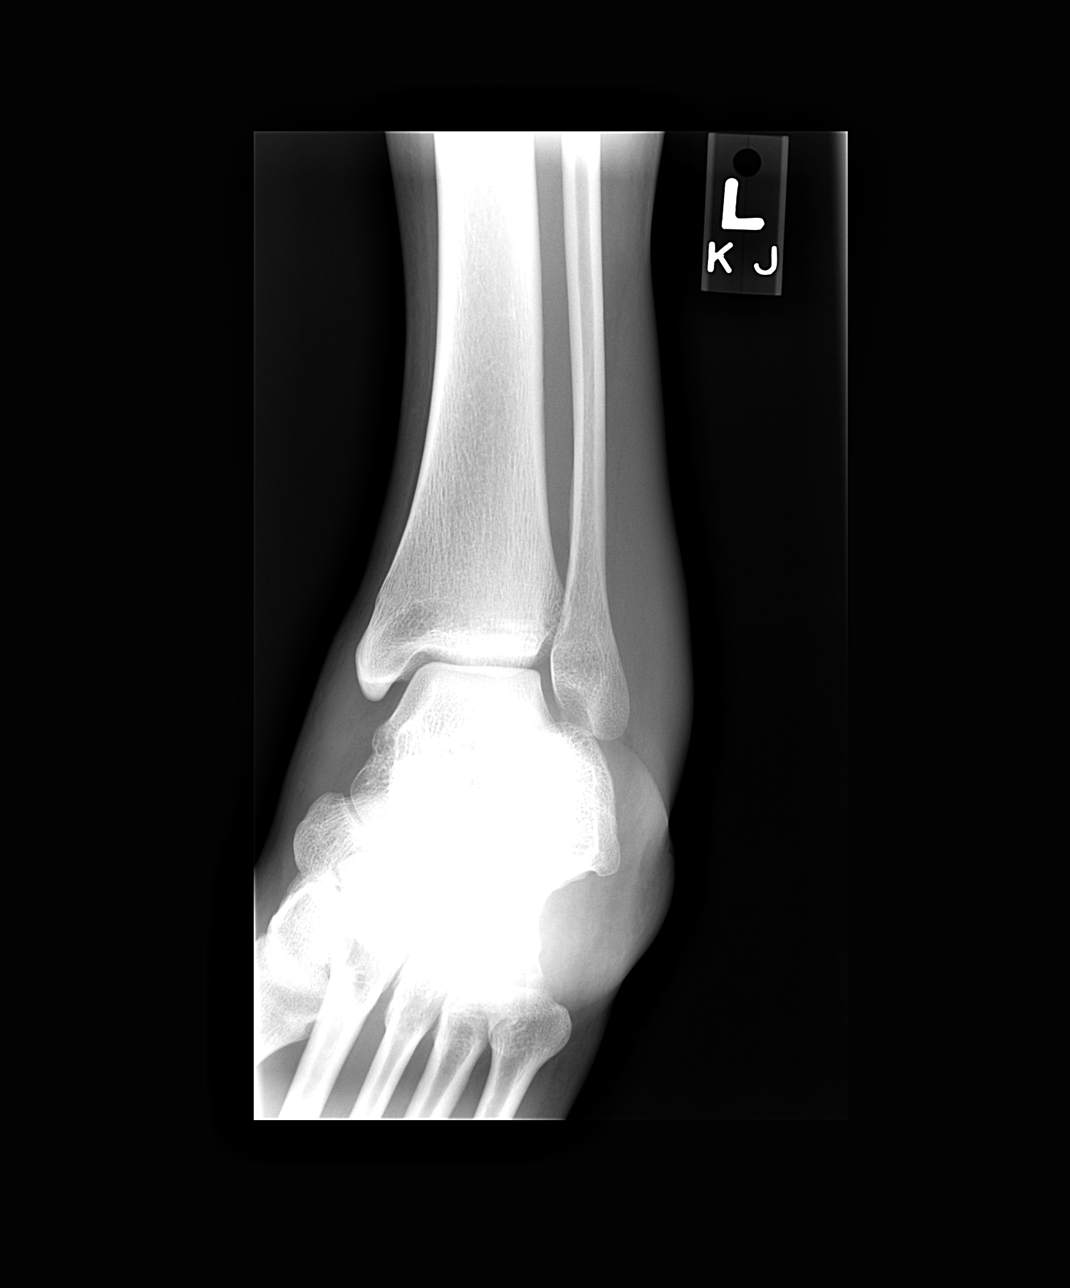

[view not recorded (3 of 3)]
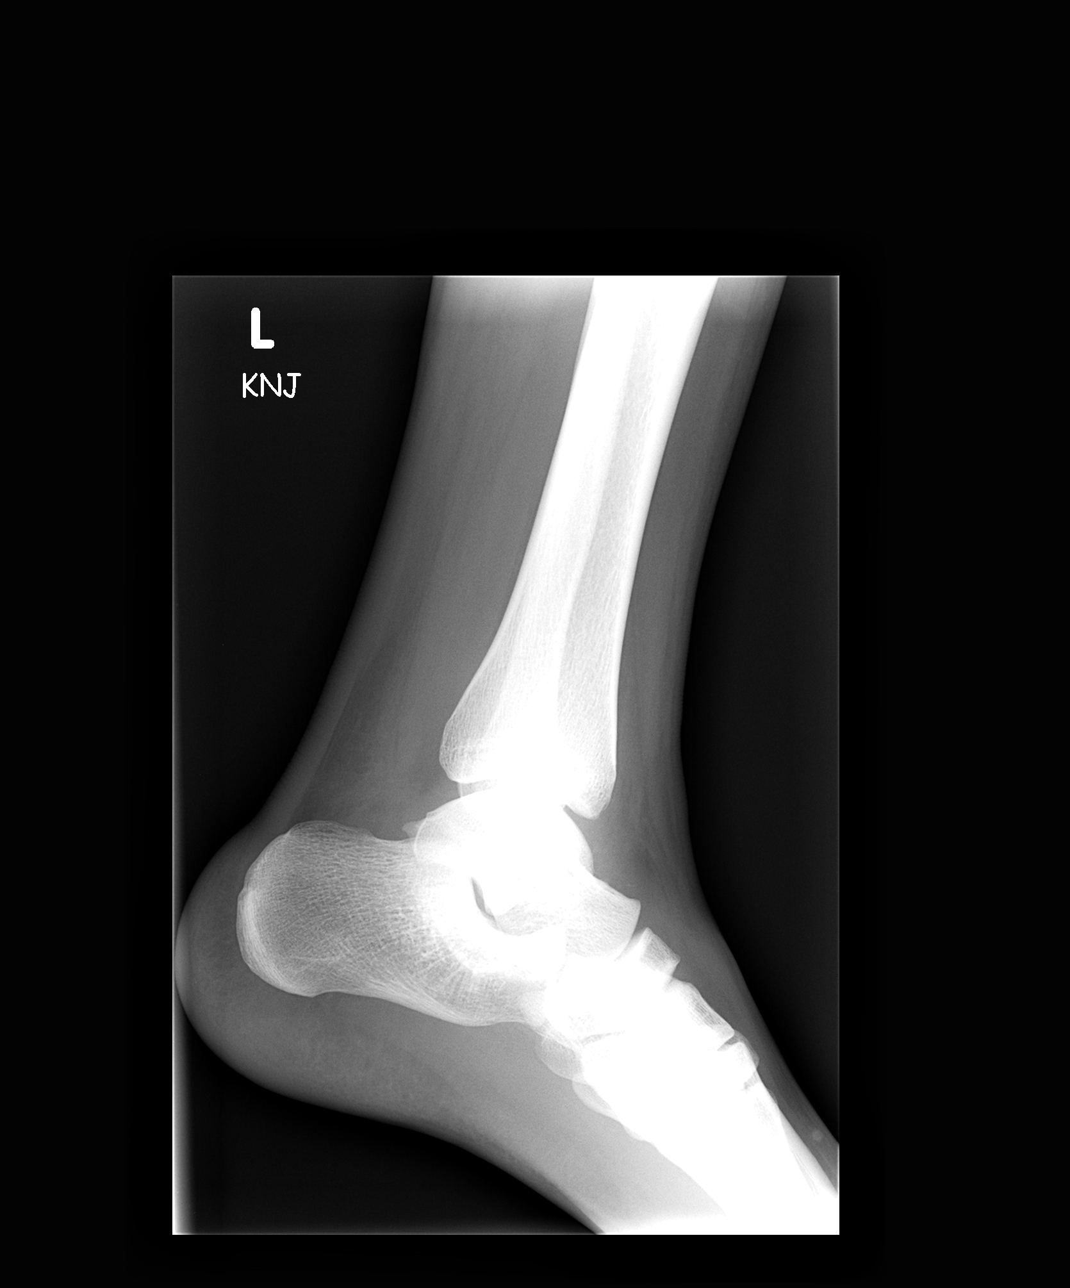

[3 of 3 positions shown; findings below may reference images not displayed]

FINDINGS: Significant soft tissue swelling laterally.

Osseous mineralization normal.

Joint spaces preserved.

Single tiny indeterminate bony density is identified at the lateral
joint line, unable to exclude tiny avulsion fracture.

No additional fracture, dislocation or bone destruction.
IMPRESSION: Cannot exclude tiny avulsion fracture at lateral joint line.

## 2015-12-11 ENCOUNTER — Emergency Department (HOSPITAL_COMMUNITY)
Admission: EM | Admit: 2015-12-11 | Discharge: 2015-12-11 | Disposition: A | Discharge status: Home / Self Care | Payer: Medicaid Other | Attending: Emergency Medicine | Admitting: Emergency Medicine

## 2015-12-11 ENCOUNTER — Encounter (HOSPITAL_COMMUNITY): Payer: Self-pay

## 2015-12-11 DIAGNOSIS — Z79899 Other long term (current) drug therapy: Secondary | ICD-10-CM | POA: Insufficient documentation

## 2015-12-11 DIAGNOSIS — Z87891 Personal history of nicotine dependence: Secondary | ICD-10-CM | POA: Diagnosis not present

## 2015-12-11 DIAGNOSIS — N898 Other specified noninflammatory disorders of vagina: Secondary | ICD-10-CM

## 2015-12-11 DIAGNOSIS — N760 Acute vaginitis: Secondary | ICD-10-CM | POA: Insufficient documentation

## 2015-12-11 DIAGNOSIS — B9689 Other specified bacterial agents as the cause of diseases classified elsewhere: Secondary | ICD-10-CM

## 2015-12-11 DIAGNOSIS — Z202 Contact with and (suspected) exposure to infections with a predominantly sexual mode of transmission: Secondary | ICD-10-CM | POA: Diagnosis present

## 2015-12-11 DIAGNOSIS — Z113 Encounter for screening for infections with a predominantly sexual mode of transmission: Secondary | ICD-10-CM | POA: Insufficient documentation

## 2015-12-11 DIAGNOSIS — Z8619 Personal history of other infectious and parasitic diseases: Secondary | ICD-10-CM | POA: Diagnosis not present

## 2015-12-11 LAB — URINE MICROSCOPIC-ADD ON

## 2015-12-11 LAB — URINALYSIS, ROUTINE W REFLEX MICROSCOPIC
Bilirubin Urine: NEGATIVE
Glucose, UA: NEGATIVE mg/dL
Hgb urine dipstick: NEGATIVE
KETONES UR: NEGATIVE mg/dL
NITRITE: NEGATIVE
PH: 6.5 (ref 5.0–8.0)
PROTEIN: NEGATIVE mg/dL
Specific Gravity, Urine: 1.023 (ref 1.005–1.030)

## 2015-12-11 LAB — WET PREP, GENITAL
SPERM: NONE SEEN
TRICH WET PREP: NONE SEEN
YEAST WET PREP: NONE SEEN

## 2015-12-11 MED ORDER — AZITHROMYCIN 250 MG PO TABS
1000.0000 mg | ORAL_TABLET | Freq: Once | ORAL | Status: AC
  Administered 2015-12-11: 1000 mg via ORAL
  Filled 2015-12-11: qty 4

## 2015-12-11 MED ORDER — LIDOCAINE HCL (PF) 1 % IJ SOLN
INTRAMUSCULAR | Status: AC
  Administered 2015-12-11: 0.9 mL via INTRAMUSCULAR
  Filled 2015-12-11: qty 5

## 2015-12-11 MED ORDER — METRONIDAZOLE 500 MG PO TABS: 500.0000 mg | ORAL_TABLET | Freq: Two times a day (BID) | ORAL | Status: DC

## 2015-12-11 MED ORDER — CEFTRIAXONE SODIUM 250 MG IJ SOLR
250.0000 mg | Freq: Once | INTRAMUSCULAR | Status: AC
  Administered 2015-12-11: 250 mg via INTRAMUSCULAR
  Filled 2015-12-11: qty 250

## 2015-12-11 NOTE — Discharge Instructions (Signed)

## 2015-12-11 NOTE — ED Provider Notes (Signed)
CSN: [id number]     Arrival date & time 12/11/15  1638 History   First MD Initiated Contact with Patient 12/11/15 1653     Chief Complaint  Patient presents with  . Exposure to STD     HPI Comments: 25 year old female presents with vaginal discharge for the past several days. She states her boyfriend has been having dysuria and is getting tested as well. She does not use protection or birth control. Last menstrual period was one month ago. She does have a history of STDs. She denies fever, chills, abdominal pain, nausea, vomiting, flank pain, dysuria.   Past Medical History  Diagnosis Date  . Medical history non-contributory   . Former smoker     Quit 02/2014  . History of gonorrhea 06/2014    Tx'd, TOC= Neg  . History of chlamydia 06/2014    Tx'd, TOC= Neg   Past Surgical History  Procedure Laterality Date  . No past surgeries     Family History  Problem Relation Age of Onset  . Hypertension Mother   . Hypertension Father    Social History  Substance Use Topics  . Smoking status: Former Smoker -- 1.00 packs/day    Quit date: 02/21/2014  . Smokeless tobacco: None  . Alcohol Use: No   OB History    Gravida Para Term Preterm AB TAB SAB Ectopic Multiple Living   0 1     Review of Systems  Constitutional: Negative for fever and chills.  Gastrointestinal: Negative for nausea, vomiting, abdominal pain and diarrhea.  Genitourinary: Positive for vaginal discharge. Negative for dysuria, frequency, menstrual problem and pelvic pain.  All other systems reviewed and are negative.     Allergies  Review of patient's allergies indicates no known allergies.  Home Medications   Prior to Admission medications   Medication Sig Start Date End Date Taking? Authorizing Provider  Prenatal Vit-Fe Fumarate-FA (PRENATAL VITAMINS) 28-0.8 MG TABS Take 1 tablet by mouth daily. Gummies   Yes Historical Provider, MD   BP 120/67 mmHg  Pulse 78  Temp(Src) 98.8 F (37.1 C)   Resp 18  Ht  (1.727 m)  Wt 69.4 kg  BMI 23.27 kg/m2  SpO2 99%  LMP 12/01/2015   Physical Exam  Constitutional: She is oriented to person, place, and time. She appears well-developed and well-nourished. No distress.  HENT:  Head: Normocephalic and atraumatic.  Eyes: Conjunctivae are normal. Pupils are equal, round, and reactive to light. Right eye exhibits no discharge. Left eye exhibits no discharge. No scleral icterus.  Neck: Normal range of motion.  Pulmonary/Chest: Effort normal.  Abdominal: Soft. There is no tenderness.  Genitourinary:  Chaperone present during exam. No inguinal lymphadenopathy or inguinal hernia noted. Normal external genitalia. No pain with speculum insertion. Closed cervical os. Cervix is friable and bleeding. Significant amount of purulent discharge noted. On bimanual examination no adnexal tenderness or cervical motion tenderness.    Neurological: She is alert and oriented to person, place, and time.  Skin: Skin is warm and dry.  Psychiatric: She has a normal mood and affect.    ED Course  Procedures (including critical care time) Labs Review Labs Reviewed  WET PREP, GENITAL - Abnormal; Notable for the following:    Clue Cells Wet Prep HPF POC PRESENT (*)    WBC, Wet Prep HPF POC MANY (*)    All other components within normal limits  URINALYSIS, ROUTINE W REFLEX MICROSCOPIC (NOT AT  [facility]) - Abnormal; Notable for the following:    APPearance CLOUDY (*)    Leukocytes, UA MODERATE (*)    All other components within normal limits  URINE MICROSCOPIC-ADD ON - Abnormal; Notable for the following:    Squamous Epithelial / LPF 0-5 (*)    Bacteria, UA FEW (*)    All other components within normal limits  RPR  HIV ANTIBODY (ROUTINE TESTING)  POC URINE PREG, ED  GC/CHLAMYDIA PROBE AMP ([place]) NOT AT [facility]    Imaging Review No results found. I have personally reviewed and evaluated these images and lab results as part of my medical  decision-making.   EKG Interpretation None      MDM   Final diagnoses:  Vaginal discharge  Screen for STD (sexually transmitted disease)  Bacterial vaginosis   25 year old female presents with vaginal discharge after possible exposure to an STD. She is afebrile, not tachycardic or tachypneic, normotensive, and not hypoxic. Pelvic exam is remarkable for purulent discharge with a friable cervix. We'll treat empirically with Rocephin and azithromycin. Wet prep shows clue cells and many WBCs. Patient advised and given Rx for Flagyl. Patient is NAD, non-toxic, with stable VS. Patient is informed of clinical course, understands medical decision making process, and agrees with plan. Opportunity for questions provided and all questions answered. Return precautions given.      Sara BornKelly Marie Cherry Wittwer, PA-C 12/11/15 1946  Donnetta HutchingBrian Cook, MD 12/12/15 [phone]

## 2015-12-11 NOTE — ED Notes (Signed)
Patient here with 2 days of vaginal discharge and concerned she may have STD

## 2015-12-11 NOTE — ED Notes (Signed)
Pt verbalized understanding of d/c instructions and has no further questions. Pt stable and NAD.  

## 2015-12-11 NOTE — ED Notes (Signed)
Family at bedside. 

## 2015-12-12 LAB — RPR: RPR Ser Ql: NONREACTIVE

## 2015-12-12 LAB — HIV ANTIBODY (ROUTINE TESTING W REFLEX): HIV Screen 4th Generation wRfx: NONREACTIVE

## 2015-12-13 LAB — GC/CHLAMYDIA PROBE AMP (~~LOC~~) NOT AT ARMC
Chlamydia: POSITIVE — AB
Neisseria Gonorrhea: POSITIVE — AB

## 2015-12-14 ENCOUNTER — Telehealth: Payer: Self-pay | Admitting: *Deleted

## 2015-12-14 NOTE — ED Notes (Signed)
Unable to contact by phone regarding (+) culture results, letter sent. 

## 2016-02-05 ENCOUNTER — Telehealth: Payer: Self-pay | Admitting: *Deleted

## 2016-02-05 NOTE — Telephone Encounter (Signed)
No response to letter sent to home, unable to notify of (+)STD, treatment received at ER visit

## 2016-03-19 IMAGING — US US OB COMP +14 WK
2 series · 12 of 28 positions shown · non-contrast
Comparison: none

[Series 1: us ob comp +14 wk mfm · 11 of 100 slices shown (1 of 2)]
[im 4/100]
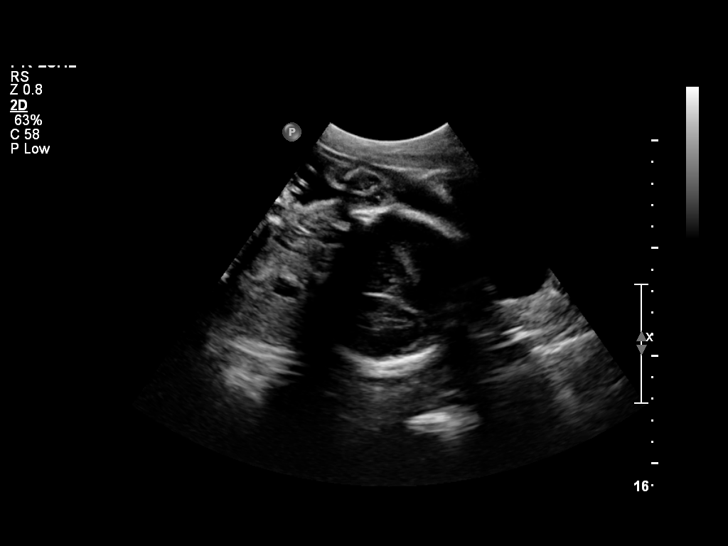
[im 12/100]
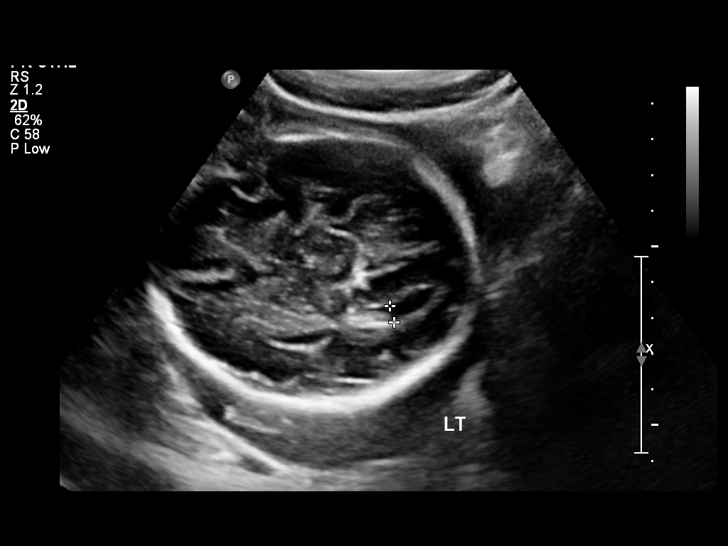
[im 20/100]
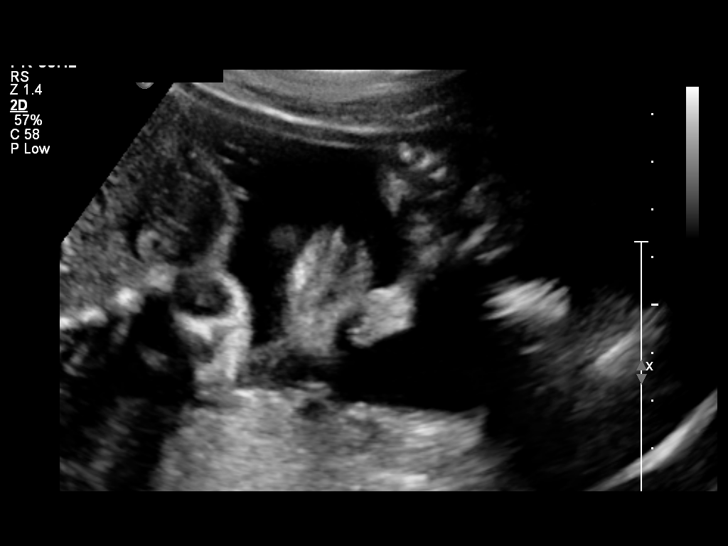
[im 32/100]
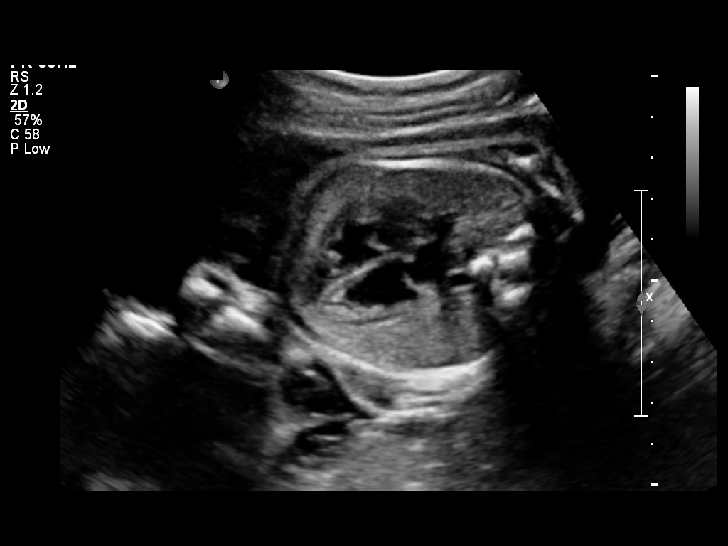
[im 40/100]
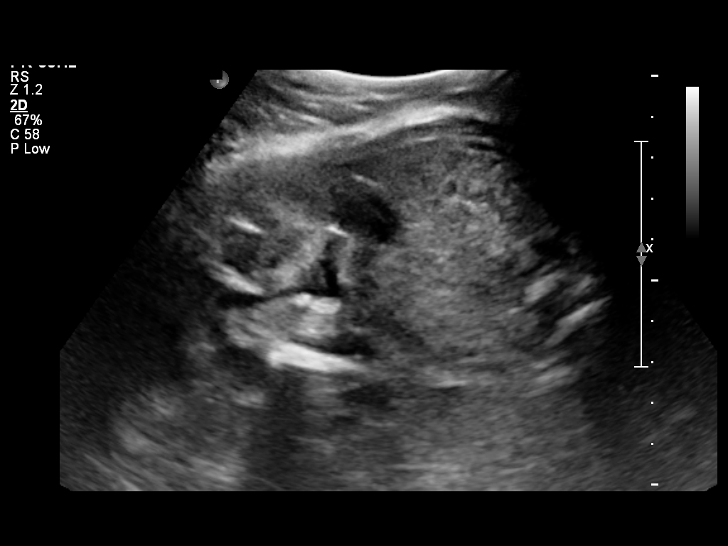
[im 48/100]
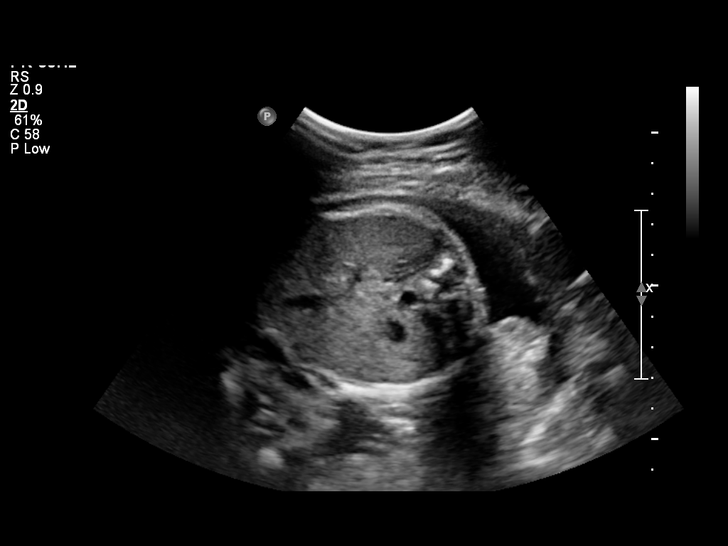
[im 60/100]
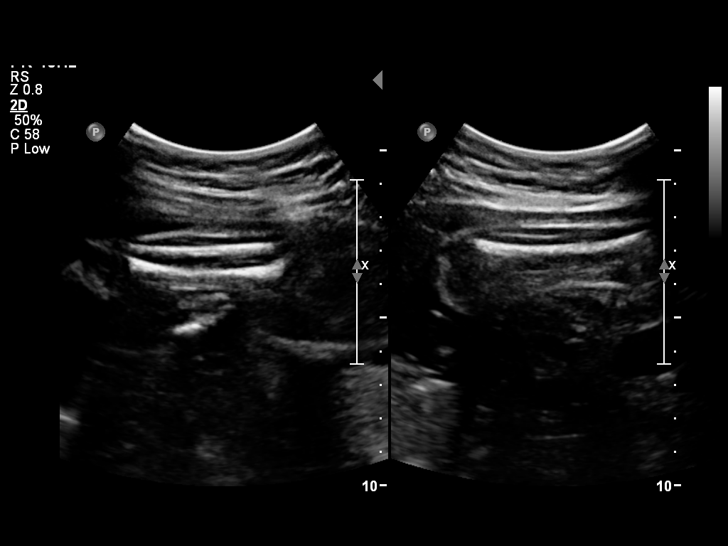
[im 68/100]
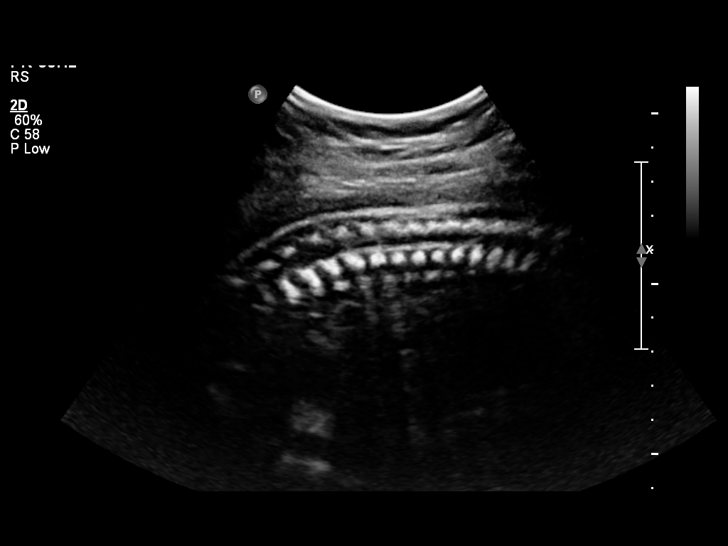
[im 76/100]
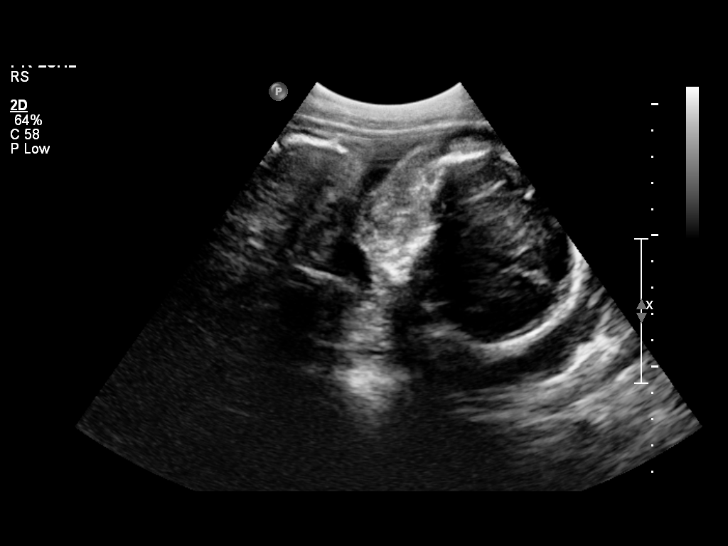
[im 88/100]
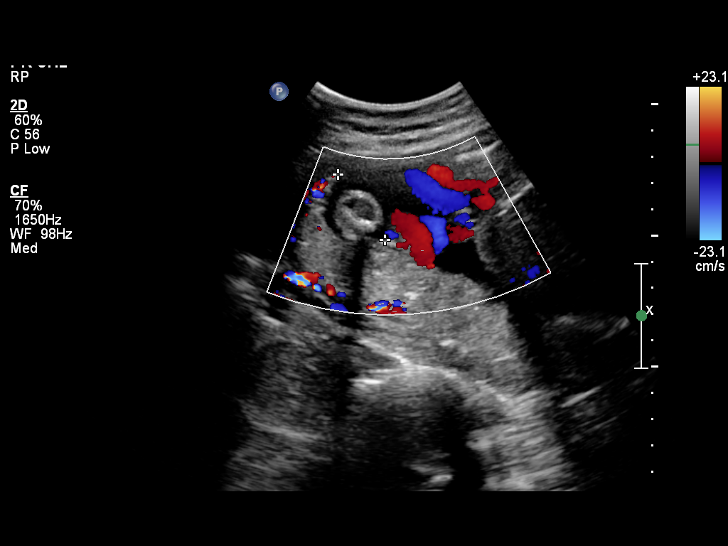
[im 96/100]
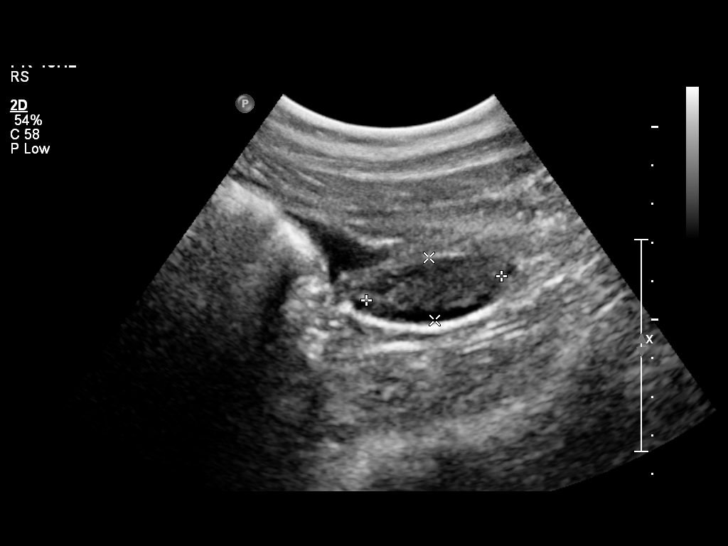

[Series 1: us ob comp +14 wk mfm · 1 of 8 slices shown (2 of 2)]
[im 1/8]
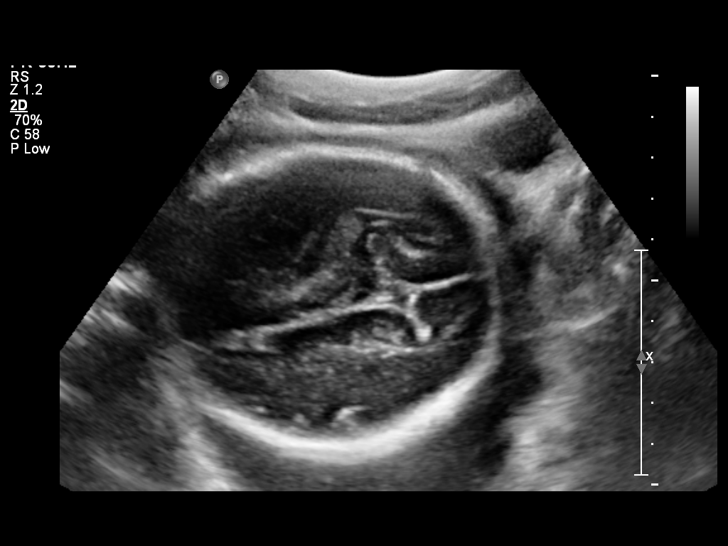

[12 of 28 positions shown; findings below may reference images not displayed]

OBSTETRICS REPORT
                      (Signed Final 07/10/2014 [DATE])

Service(s) Provided

 US OB COMP + 14 WK                                    76805.1
Indications

 28 weeks gestation of pregnancy
 Basic anatomic survey                                 [id number]
 No or Little Prenatal Care
Fetal Evaluation

 Num Of Fetuses:    1
 Fetal Heart Rate:  140                          bpm
 Cardiac Activity:  Observed
 Presentation:      Cephalic
 Placenta:          Posterior, above cervical
                    os
 P. Cord            Visualized
 Insertion:

 Amniotic Fluid
 AFI FV:      Subjectively within normal limits
 AFI Sum:     12.83   cm       35  %Tile     Larg Pckt:    4.33  cm
 RUQ:   3.06    cm   RLQ:    2.36   cm    LUQ:   3.08    cm   LLQ:    4.33   cm
Biometry

 BPD:     72.4  mm     G. Age:  29w 0d                CI:         80.4   70 - 86
 OFD:     90.1  mm                                    FL/HC:      19.5   18.8 -

 HC:     263.7  mm     G. Age:  28w 5d       41  %    HC/AC:      1.18   1.05 -

 AC:     224.3  mm     G. Age:  26w 6d       14  %    FL/BPD:     71.1   71 - 87
 FL:      51.5  mm     G. Age:  27w 4d       23  %    FL/AC:      23.0   20 - 24
 HUM:     48.1  mm     G. Age:  28w 1d       51  %
 CER:     33.8  mm     G. Age:  29w 3d       70  %

 Est. FW:    0515  gm      2 lb 6 oz     38  %
Gestational Age

 LMP:           26w 4d        Date:  01/05/14                 EDD:   10/12/14
 U/S Today:     28w 0d                                        EDD:   10/02/14
 Best:          28w 0d     Det. By:  U/S (07/10/14)           EDD:   10/02/14
Anatomy

 Cranium:          Appears normal         Aortic Arch:      Appears normal
 Fetal Cavum:      Appears normal         Ductal Arch:      Appears normal
 Ventricles:       Appears normal         Diaphragm:        Appears normal
 Choroid Plexus:   Appears normal         Stomach:          Appears normal, left
                                                            sided
 Cerebellum:       Appears normal         Abdomen:          Appears normal
 Posterior Fossa:  Appears normal         Abdominal Wall:   Appears nml (cord
                                                            insert, abd wall)
 Nuchal Fold:      Not applicable (>20    Cord Vessels:     Appears normal (3
                   wks GA)                                  vessel cord)
 Face:             Appears normal         Kidneys:          Appear normal
                   (orbits and profile)
 Lips:             Appears normal         Bladder:          Appears normal
 Heart:            Appears normal         Spine:            Appears normal
                   (4CH, axis, and
                   situs)
 RVOT:             Appears normal         Lower             Appears normal
                                          Extremities:
 LVOT:             Appears normal         Upper             Appears normal
                                          Extremities:

 Other:  Fetus appears to be a male. Technically difficult due to advanced GA
         and fetal position.
Targeted Anatomy

 Fetal Central Nervous System
 Lat. Ventricles:  4.7                    Cerebellum:
Cervix Uterus Adnexa

 Cervical Length:    3.3      cm

 Cervix:       Normal appearance by transabdominal scan.
 Uterus:       No abnormality visualized.
 Cul De Sac:   No free fluid seen.
 Left Ovary:    Within normal limits.
 Right Ovary:   Within normal limits.

 Adnexa:     No adnexal mass visualized.
Impression

 Single IUP at 28w 0d
 Late onset of care
 Normal fatal anatomic survey
 Fetal growth is appropriate (38th %tile)
 Posterior placenta without previa
 Normal amniotic fluid volume
Recommendations

 Recommend follow-up ultrasound examination in 4 weeks for
 growth due to late onset of care and poor dates.

 questions or concerns.

## 2016-03-23 ENCOUNTER — Telehealth: Payer: Self-pay | Admitting: *Deleted

## 2016-03-23 NOTE — Telephone Encounter (Signed)
Pt left message yesterday stating that she thinks she has BV and is requesting treatment. She further states that she recently had yeast. Per chart review, pt was seen @ ED on 5/20 and was positive for GC and Chlamydia. Notes indicate that pt did not receive a letter which was sent regarding the need for treatment.

## 2016-04-10 NOTE — Telephone Encounter (Signed)
I have spoken with patient and patient needs appointment to come in tomorrow. I will consult with provider.

## 2016-04-17 ENCOUNTER — Ambulatory Visit: Payer: Medicaid Other | Admitting: Advanced Practice Midwife

## 2018-01-17 ENCOUNTER — Encounter (HOSPITAL_COMMUNITY): Payer: Self-pay

## 2018-01-17 ENCOUNTER — Inpatient Hospital Stay (HOSPITAL_COMMUNITY)
Admission: AD | Admit: 2018-01-17 | Discharge: 2018-01-17 | Disposition: A | Discharge status: Home / Self Care | Payer: Medicaid Other | Source: Ambulatory Visit | Attending: Obstetrics and Gynecology | Admitting: Obstetrics and Gynecology

## 2018-01-17 DIAGNOSIS — O9989 Other specified diseases and conditions complicating pregnancy, childbirth and the puerperium: Secondary | ICD-10-CM | POA: Diagnosis not present

## 2018-01-17 DIAGNOSIS — Z3A39 39 weeks gestation of pregnancy: Secondary | ICD-10-CM | POA: Insufficient documentation

## 2018-01-17 DIAGNOSIS — Z3493 Encounter for supervision of normal pregnancy, unspecified, third trimester: Secondary | ICD-10-CM

## 2018-01-17 DIAGNOSIS — Z87891 Personal history of nicotine dependence: Secondary | ICD-10-CM | POA: Diagnosis not present

## 2018-01-17 DIAGNOSIS — O26893 Other specified pregnancy related conditions, third trimester: Secondary | ICD-10-CM | POA: Diagnosis not present

## 2018-01-17 DIAGNOSIS — N898 Other specified noninflammatory disorders of vagina: Secondary | ICD-10-CM

## 2018-01-17 LAB — WET PREP, GENITAL
Clue Cells Wet Prep HPF POC: NONE SEEN
SPERM: NONE SEEN
Trich, Wet Prep: NONE SEEN
Yeast Wet Prep HPF POC: NONE SEEN

## 2018-01-17 LAB — URINALYSIS, ROUTINE W REFLEX MICROSCOPIC
Bilirubin Urine: NEGATIVE
GLUCOSE, UA: 150 mg/dL — AB
HGB URINE DIPSTICK: NEGATIVE
Ketones, ur: NEGATIVE mg/dL
Nitrite: NEGATIVE
PROTEIN: NEGATIVE mg/dL
Specific Gravity, Urine: 1.015 (ref 1.005–1.030)
pH: 7 (ref 5.0–8.0)

## 2018-01-17 LAB — POCT FERN TEST: POCT Fern Test: NEGATIVE

## 2018-01-17 NOTE — MAU Provider Note (Signed)
History     CSN: [id number]  Arrival date and time: 01/17/18 1032   First Provider Initiated Contact with Patient 01/17/18 1117      Chief Complaint  Patient presents with  . Rupture of Membranes   HPI  Woke up with wet bed, felt gush of fluid after voiding, no new discharge or leaking. Denies vaginal bleeding, decreased fetal movement, fever, falls, or recent illness.    OB History    Gravida  2   Para  1   Term  1   Preterm      AB      Living  1     SAB      TAB      Ectopic      Multiple  0   Live Births  1           Past Medical History:  Diagnosis Date  . Former smoker    Quit 02/2014  . History of chlamydia 06/2014   Tx'd, TOC= Neg  . History of gonorrhea 06/2014   Tx'd, TOC= Neg  . Medical history non-contributory     Past Surgical History:  Procedure Laterality Date  . NO PAST SURGERIES      Family History  Problem Relation Age of Onset  . Hypertension Mother   . Hypertension Father     Social History   Tobacco Use  . Smoking status: Former Smoker    Packs/day: 1.00  . Smokeless tobacco: Never Used  . Tobacco comment: stopped October 2018  Substance Use Topics  . Alcohol use: No  . Drug use: No    Types: Marijuana    Comment: Last smoked April 2019    Allergies: Not on File  Medications Prior to Admission  Medication Sig Dispense Refill Last Dose  . metroNIDAZOLE (FLAGYL) 500 MG tablet Take 1 tablet (500 mg total) by mouth 2 (two) times daily. 14 tablet 0   . Prenatal Vit-Fe Fumarate-FA (PRENATAL VITAMINS) 28-0.8 MG TABS Take 1 tablet by mouth daily. Gummies   12/10/2015 at Unknown time    Review of Systems  Gastrointestinal: Negative for abdominal pain, nausea and vomiting.  Genitourinary: Positive for vaginal discharge. Negative for difficulty urinating, dysuria, pelvic pain, vaginal bleeding and vaginal pain.  Neurological: Negative for dizziness and headaches.  All other systems reviewed and are  negative.  Physical Exam   Blood pressure 131/75, pulse (!) 111, temperature 98.6 F (37 C), temperature source Oral, resp. rate 16, weight 197 lb (89.4 kg), SpO2 96 %, currently breastfeeding.  Physical Exam  Nursing note and vitals reviewed. Constitutional: She is oriented to person, place, and time. She appears well-developed and well-nourished.  Cardiovascular: Normal rate, regular rhythm, normal heart sounds and intact distal pulses.  Respiratory: Effort normal and breath sounds normal.  GI:  Gravid  Genitourinary: Vagina normal and uterus normal.  Genitourinary Comments: Creamy white mucus visible on speculum exam   Musculoskeletal: Normal range of motion.  Neurological: She is alert and oriented to person, place, and time. She has normal reflexes.  Skin: Skin is warm and dry.  Psychiatric: She has a normal mood and affect. Her behavior is normal. Judgment and thought content normal.    EFM baseline 150, moderate variability, positive accelerations, no decelerations  Toco: rare contractions, palpate mild  MAU Course  Procedures  MDM Ferning negative Pooling negative Normal wet prep . Results for orders placed or performed during the hospital encounter of 01/17/18 (from the past 24 hour(s))  Urinalysis, Routine w reflex microscopic     Status: Abnormal   Collection Time: 01/17/18 10:51 AM  Result Value Ref Range   Color, Urine YELLOW YELLOW   APPearance HAZY (A) CLEAR   Specific Gravity, Urine 1.015 1.005 - 1.030   pH 7.0 5.0 - 8.0   Glucose, UA 150 (A) NEGATIVE mg/dL   Hgb urine dipstick NEGATIVE NEGATIVE   Bilirubin Urine NEGATIVE NEGATIVE   Ketones, ur NEGATIVE NEGATIVE mg/dL   Protein, ur NEGATIVE NEGATIVE mg/dL   Nitrite NEGATIVE NEGATIVE   Leukocytes, UA MODERATE (A) NEGATIVE   RBC / HPF 0-5 0 - 5 RBC/hpf   WBC, UA 0-5 0 - 5 WBC/hpf   Bacteria, UA RARE (A) NONE SEEN   Squamous Epithelial / LPF 6-10 0 - 5   Mucus PRESENT   Wet prep, genital     Status:  Abnormal   Collection Time: 01/17/18 11:23 AM  Result Value Ref Range   Yeast Wet Prep HPF POC NONE SEEN NONE SEEN   Trich, Wet Prep NONE SEEN NONE SEEN   Clue Cells Wet Prep HPF POC NONE SEEN NONE SEEN   WBC, Wet Prep HPF POC MANY (A) NONE SEEN   Sperm NONE SEEN       Assessment and Plan  --27 y.o. G2P1001 at 3465w2d  --Intact membranes --Reactive NST --Urine culture sent --Reviewed general obstetric precautions --Discharge home in stable condition   Calvert CantorSamantha C Weinhold, CNM 01/17/2018, 12:07 PM

## 2018-01-17 NOTE — Discharge Instructions (Signed)
Vaginal Delivery Vaginal delivery means that you will give birth by pushing your baby out of your birth canal (vagina). A team of health care providers will help you before, during, and after vaginal delivery. Birth experiences are unique for every woman and every pregnancy, and birth experiences vary depending on where you choose to give birth. What should I do to prepare for my baby's birth? Before your baby is born, it is important to talk with your health care provider about:  Your labor and delivery preferences. These may include: ? Medicines that you may be given. ? How you will manage your pain. This might include non-medical pain relief techniques or injectable pain relief such as epidural analgesia. ? How you and your baby will be monitored during labor and delivery. ? Who may be in the labor and delivery room with you. ? Your feelings about surgical delivery of your baby (cesarean delivery, or C-section) if this becomes necessary. ? Your feelings about receiving donated blood through an IV tube (blood transfusion) if this becomes necessary.  Whether you are able: ? To take pictures or videos of the birth. ? To eat during labor and delivery. ? To move around, walk, or change positions during labor and delivery.  What to expect after your baby is born, such as: ? Whether delayed umbilical cord clamping and cutting is offered. ? Who will care for your baby right after birth. ? Medicines or tests that may be recommended for your baby. ? Whether breastfeeding is supported in your hospital or birth center. ? How long you will be in the hospital or birth center.  How any medical conditions you have may affect your baby or your labor and delivery experience.  To prepare for your baby's birth, you should also:  Attend all of your health care visits before delivery (prenatal visits) as recommended by your health care provider. This is important.  Prepare your home for your baby's  arrival. Make sure that you have: ? Diapers. ? Baby clothing. ? Feeding equipment. ? Safe sleeping arrangements for you and your baby.  Install a car seat in your vehicle. Have your car seat checked by a certified car seat installer to make sure that it is installed safely.  Think about who will help you with your new baby at home for at least the first several weeks after delivery.  What can I expect when I arrive at the birth center or hospital? Once you are in labor and have been admitted into the hospital or birth center, your health care provider may:  Review your pregnancy history and any concerns you have.  Insert an IV tube into one of your veins. This is used to give you fluids and medicines.  Check your blood pressure, pulse, temperature, and heart rate (vital signs).  Check whether your bag of water (amniotic sac) has broken (ruptured).  Talk with you about your birth plan and discuss pain control options.  Monitoring Your health care provider may monitor your contractions (uterine monitoring) and your baby's heart rate (fetal monitoring). You may need to be monitored:  Often, but not continuously (intermittently).  All the time or for long periods at a time (continuously). Continuous monitoring may be needed if: ? You are taking certain medicines, such as medicine to relieve pain or make your contractions stronger. ? You have pregnancy or labor complications.  Monitoring may be done by:  Placing a special stethoscope or a handheld monitoring device on your abdomen to   check your baby's heartbeat, and feeling your abdomen for contractions. This method of monitoring does not continuously record your baby's heartbeat or your contractions.  Placing monitors on your abdomen (external monitors) to record your baby's heartbeat and the frequency and length of contractions. You may not have to wear external monitors all the time.  Placing monitors inside of your uterus  (internal monitors) to record your baby's heartbeat and the frequency, length, and strength of your contractions. ? Your health care provider may use internal monitors if he or she needs more information about the strength of your contractions or your baby's heart rate. ? Internal monitors are put in place by passing a thin, flexible wire through your vagina and into your uterus. Depending on the type of monitor, it may remain in your uterus or on your baby's head until birth. ? Your health care provider will discuss the benefits and risks of internal monitoring with you and will ask for your permission before inserting the monitors.  Telemetry. This is a type of continuous monitoring that can be done with external or internal monitors. Instead of having to stay in bed, you are able to move around during telemetry. Ask your health care provider if telemetry is an option for you.  Physical exam Your health care provider may perform a physical exam. This may include:  Checking whether your baby is positioned: ? With the head toward your vagina (head-down). This is most common. ? With the head toward the top of your uterus (head-up or breech). If your baby is in a breech position, your health care provider may try to turn your baby to a head-down position so you can deliver vaginally. If it does not seem that your baby can be born vaginally, your provider may recommend surgery to deliver your baby. In rare cases, you may be able to deliver vaginally if your baby is head-up (breech delivery). ? Lying sideways (transverse). Babies that are lying sideways cannot be delivered vaginally.  Checking your cervix to determine: ? Whether it is thinning out (effacing). ? Whether it is opening up (dilating). ? How low your baby has moved into your birth canal.  What are the three stages of labor and delivery?  Normal labor and delivery is divided into the following three stages: Stage 1  Stage 1 is the  longest stage of labor, and it can last for hours or days. Stage 1 includes: ? Early labor. This is when contractions may be irregular, or regular and mild. Generally, early labor contractions are more than 10 minutes apart. ? Active labor. This is when contractions get longer, more regular, more frequent, and more intense. ? The transition phase. This is when contractions happen very close together, are very intense, and may last longer than during any other part of labor.  Contractions generally feel mild, infrequent, and irregular at first. They get stronger, more frequent (about every 2-3 minutes), and more regular as you progress from early labor through active labor and transition.  Many women progress through stage 1 naturally, but you may need help to continue making progress. If this happens, your health care provider may talk with you about: ? Rupturing your amniotic sac if it has not ruptured yet. ? Giving you medicine to help make your contractions stronger and more frequent.  Stage 1 ends when your cervix is completely dilated to 4 inches (10 cm) and completely effaced. This happens at the end of the transition phase. Stage 2  Once   your cervix is completely effaced and dilated to 4 inches (10 cm), you may start to feel an urge to push. It is common for the body to naturally take a rest before feeling the urge to push, especially if you received an epidural or certain other pain medicines. This rest period may last for up to 1-2 hours, depending on your unique labor experience.  During stage 2, contractions are generally less painful, because pushing helps relieve contraction pain. Instead of contraction pain, you may feel stretching and burning pain, especially when the widest part of your baby's head passes through the vaginal opening (crowning).  Your health care provider will closely monitor your pushing progress and your baby's progress through the vagina during stage 2.  Your  health care provider may massage the area of skin between your vaginal opening and anus (perineum) or apply warm compresses to your perineum. This helps it stretch as the baby's head starts to crown, which can help prevent perineal tearing. ? In some cases, an incision may be made in your perineum (episiotomy) to allow the baby to pass through the vaginal opening. An episiotomy helps to make the opening of the vagina larger to allow more room for the baby to fit through.  It is very important to breathe and focus so your health care provider can control the delivery of your baby's head. Your health care provider may have you decrease the intensity of your pushing, to help prevent perineal tearing.  After delivery of your baby's head, the shoulders and the rest of the body generally deliver very quickly and without difficulty.  Once your baby is delivered, the umbilical cord may be cut right away, or this may be delayed for 1-2 minutes, depending on your baby's health. This may vary among health care providers, hospitals, and birth centers.  If you and your baby are healthy enough, your baby may be placed on your chest or abdomen to help maintain the baby's temperature and to help you bond with each other. Some mothers and babies start breastfeeding at this time. Your health care team will dry your baby and help keep your baby warm during this time.  Your baby may need immediate care if he or she: ? Showed signs of distress during labor. ? Has a medical condition. ? Was born too early (prematurely). ? Had a bowel movement before birth (meconium). ? Shows signs of difficulty transitioning from being inside the uterus to being outside of the uterus. If you are planning to breastfeed, your health care team will help you begin a feeding. Stage 3  The third stage of labor starts immediately after the birth of your baby and ends after you deliver the placenta. The placenta is an organ that develops  during pregnancy to provide oxygen and nutrients to your baby in the womb.  Delivering the placenta may require some pushing, and you may have mild contractions. Breastfeeding can stimulate contractions to help you deliver the placenta.  After the placenta is delivered, your uterus should tighten (contract) and become firm. This helps to stop bleeding in your uterus. To help your uterus contract and to control bleeding, your health care provider may: ? Give you medicine by injection, through an IV tube, by mouth, or through your rectum (rectally). ? Massage your abdomen or perform a vaginal exam to remove any blood clots that are left in your uterus. ? Empty your bladder by placing a thin, flexible tube (catheter) into your bladder. ? Encourage   you to breastfeed your baby. After labor is over, you and your baby will be monitored closely to ensure that you are both healthy until you are ready to go home. Your health care team will teach you how to care for yourself and your baby. This information is not intended to replace advice given to you by your health care provider. Make sure you discuss any questions you have with your health care provider. Document Released: 04/18/2008 Document Revised: 01/28/2016 Document Reviewed: 07/25/2015 [employer] Interactive Patient Education  2018 [employer]  

## 2018-01-17 NOTE — MAU Note (Addendum)
Pt thinks her water broke around about an hour ago. No bleeding. No ctx.   [facility].   Records show previa that has resolved.

## 2018-01-18 LAB — CULTURE, OB URINE: Culture: NO GROWTH

## 2018-04-16 ENCOUNTER — Encounter (HOSPITAL_COMMUNITY): Payer: Self-pay

## 2022-05-26 ENCOUNTER — Emergency Department (HOSPITAL_BASED_OUTPATIENT_CLINIC_OR_DEPARTMENT_OTHER)
Admission: EM | Admit: 2022-05-26 | Discharge: 2022-05-26 | Disposition: A | Discharge status: Home / Self Care | Payer: Medicaid Other | Attending: Emergency Medicine | Admitting: Emergency Medicine

## 2022-05-26 ENCOUNTER — Other Ambulatory Visit: Payer: Self-pay

## 2022-05-26 DIAGNOSIS — N3090 Cystitis, unspecified without hematuria: Secondary | ICD-10-CM | POA: Diagnosis not present

## 2022-05-26 DIAGNOSIS — F17210 Nicotine dependence, cigarettes, uncomplicated: Secondary | ICD-10-CM | POA: Insufficient documentation

## 2022-05-26 DIAGNOSIS — Y92410 Unspecified street and highway as the place of occurrence of the external cause: Secondary | ICD-10-CM | POA: Insufficient documentation

## 2022-05-26 DIAGNOSIS — S39012A Strain of muscle, fascia and tendon of lower back, initial encounter: Secondary | ICD-10-CM

## 2022-05-26 DIAGNOSIS — M545 Low back pain, unspecified: Secondary | ICD-10-CM | POA: Diagnosis present

## 2022-05-26 LAB — URINALYSIS, MICROSCOPIC (REFLEX): WBC, UA: NONE SEEN WBC/hpf (ref 0–5)

## 2022-05-26 LAB — URINALYSIS, ROUTINE W REFLEX MICROSCOPIC
Bilirubin Urine: NEGATIVE
Glucose, UA: NEGATIVE mg/dL
Ketones, ur: NEGATIVE mg/dL
Leukocytes,Ua: NEGATIVE
Nitrite: POSITIVE — AB
Protein, ur: NEGATIVE mg/dL
Specific Gravity, Urine: 1.025 (ref 1.005–1.030)
pH: 6.5 (ref 5.0–8.0)

## 2022-05-26 LAB — PREGNANCY, URINE: Preg Test, Ur: NEGATIVE

## 2022-05-26 MED ORDER — CEFADROXIL 500 MG PO CAPS: 500.0000 mg | ORAL_CAPSULE | Freq: Two times a day (BID) | ORAL | 0 refills | Status: AC

## 2022-05-26 MED ORDER — IBUPROFEN 600 MG PO TABS: 600.0000 mg | ORAL_TABLET | Freq: Four times a day (QID) | ORAL | 0 refills | Status: AC | PRN

## 2022-05-26 MED ORDER — CEFTRIAXONE SODIUM 1 G IJ SOLR
1.0000 g | Freq: Once | INTRAMUSCULAR | Status: AC
  Administered 2022-05-26: 1 g via INTRAMUSCULAR
  Filled 2022-05-26: qty 10

## 2022-05-26 MED ORDER — ACETAMINOPHEN 325 MG PO TABS
650.0000 mg | ORAL_TABLET | Freq: Once | ORAL | Status: AC
  Administered 2022-05-26: 650 mg via ORAL
  Filled 2022-05-26: qty 2

## 2022-05-26 MED ORDER — LIDOCAINE HCL (PF) 1 % IJ SOLN
INTRAMUSCULAR | Status: AC
  Administered 2022-05-26: 5 mL
  Filled 2022-05-26: qty 5
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
## 2022-05-26 NOTE — Discharge Instructions (Signed)
Note that your visit to the emergency department today was overall reassuring.  We treated your urinary tract infection with 1 shot of antibiotics while emergency department.  Recommend continue oral antibiotics twice daily for the next 6 days.  Also take ibuprofen as needed for back pain.  Please not hesitate to return to emergency department for worrisome signs and symptoms we discussed become apparent.
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
## 2022-05-28 ENCOUNTER — Telehealth: Payer: Self-pay

## 2022-05-28 NOTE — Telephone Encounter (Signed)
Patient called in stating that her ibuprophen script went through but not the duricef. Called pharmacy and left message on prescription line with dosage as ordered.
